# Patient Record
Sex: Female | Born: 1975 | Race: Black or African American | Hispanic: No | Marital: Married | Smoking: Never smoker
Health system: Southern US, Community
[De-identification: ages and names within clinical notes are randomized; demographics above are authoritative.]

## PROBLEM LIST (undated history)

## (undated) DIAGNOSIS — F4310 Post-traumatic stress disorder, unspecified: Secondary | ICD-10-CM

## (undated) DIAGNOSIS — R51 Headache: Secondary | ICD-10-CM

## (undated) DIAGNOSIS — I209 Angina pectoris, unspecified: Secondary | ICD-10-CM

## (undated) DIAGNOSIS — G629 Polyneuropathy, unspecified: Secondary | ICD-10-CM

## (undated) DIAGNOSIS — R519 Headache, unspecified: Secondary | ICD-10-CM

## (undated) DIAGNOSIS — G8929 Other chronic pain: Secondary | ICD-10-CM

## (undated) DIAGNOSIS — N390 Urinary tract infection, site not specified: Secondary | ICD-10-CM

## (undated) DIAGNOSIS — J189 Pneumonia, unspecified organism: Secondary | ICD-10-CM

## (undated) DIAGNOSIS — M545 Low back pain, unspecified: Secondary | ICD-10-CM

## (undated) DIAGNOSIS — F419 Anxiety disorder, unspecified: Secondary | ICD-10-CM

## (undated) DIAGNOSIS — E559 Vitamin D deficiency, unspecified: Secondary | ICD-10-CM

## (undated) HISTORY — PX: BACK SURGERY: SHX140

---
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
## 2002-07-15 ENCOUNTER — Ambulatory Visit (HOSPITAL_COMMUNITY): Admission: RE | Admit: 2002-07-15 | Discharge: 2002-07-15 | Payer: Self-pay | Admitting: Chiropractor

## 2002-07-15 ENCOUNTER — Encounter: Payer: Self-pay | Admitting: Chiropractor

## 2002-07-16 ENCOUNTER — Emergency Department (HOSPITAL_COMMUNITY): Admission: EM | Admit: 2002-07-16 | Discharge: 2002-07-16 | Payer: Self-pay | Admitting: Emergency Medicine

## 2002-11-03 ENCOUNTER — Emergency Department (HOSPITAL_COMMUNITY): Admission: EM | Admit: 2002-11-03 | Discharge: 2002-11-03 | Payer: Self-pay | Admitting: Emergency Medicine

## 2004-02-22 ENCOUNTER — Emergency Department (HOSPITAL_COMMUNITY): Admission: EM | Admit: 2004-02-22 | Discharge: 2004-02-22 | Payer: Self-pay | Admitting: Emergency Medicine

## 2005-04-17 ENCOUNTER — Emergency Department (HOSPITAL_COMMUNITY): Admission: EM | Admit: 2005-04-17 | Discharge: 2005-04-17 | Payer: Self-pay | Admitting: Emergency Medicine

## 2005-06-21 ENCOUNTER — Inpatient Hospital Stay (HOSPITAL_COMMUNITY): Admission: AD | Admit: 2005-06-21 | Discharge: 2005-06-21 | Payer: Self-pay | Admitting: *Deleted

## 2005-08-10 ENCOUNTER — Other Ambulatory Visit: Admission: RE | Admit: 2005-08-10 | Discharge: 2005-08-10 | Payer: Self-pay | Admitting: Obstetrics and Gynecology

## 2005-09-15 ENCOUNTER — Ambulatory Visit (HOSPITAL_COMMUNITY): Admission: RE | Admit: 2005-09-15 | Discharge: 2005-09-15 | Payer: Self-pay | Admitting: Obstetrics and Gynecology

## 2006-01-28 ENCOUNTER — Emergency Department (HOSPITAL_COMMUNITY): Admission: EM | Admit: 2006-01-28 | Discharge: 2006-01-28 | Payer: Self-pay | Admitting: Emergency Medicine

## 2006-08-07 HISTORY — PX: TUBAL LIGATION: SHX77

## 2006-12-21 ENCOUNTER — Encounter: Admission: RE | Admit: 2006-12-21 | Discharge: 2006-12-21 | Payer: Self-pay | Admitting: Orthopaedic Surgery

## 2006-12-26 ENCOUNTER — Encounter: Admission: RE | Admit: 2006-12-26 | Discharge: 2006-12-26 | Payer: Self-pay | Admitting: Orthopaedic Surgery

## 2007-01-22 ENCOUNTER — Encounter: Admission: RE | Admit: 2007-01-22 | Discharge: 2007-01-22 | Payer: Self-pay | Admitting: Orthopaedic Surgery

## 2007-08-05 ENCOUNTER — Emergency Department (HOSPITAL_COMMUNITY): Admission: EM | Admit: 2007-08-05 | Discharge: 2007-08-05 | Payer: Self-pay | Admitting: Emergency Medicine

## 2007-08-08 HISTORY — PX: REPLACEMENT DISC ANTERIOR LUMBAR SPINE: SUR1215

## 2008-01-21 ENCOUNTER — Encounter: Admission: RE | Admit: 2008-01-21 | Discharge: 2008-01-21 | Payer: Self-pay | Admitting: Gastroenterology

## 2008-12-26 ENCOUNTER — Emergency Department (HOSPITAL_COMMUNITY): Admission: EM | Admit: 2008-12-26 | Discharge: 2008-12-26 | Payer: Self-pay | Admitting: Family Medicine

## 2008-12-28 ENCOUNTER — Emergency Department (HOSPITAL_COMMUNITY): Admission: EM | Admit: 2008-12-28 | Discharge: 2008-12-28 | Payer: Self-pay | Admitting: Family Medicine

## 2009-04-04 ENCOUNTER — Emergency Department (HOSPITAL_COMMUNITY): Admission: EM | Admit: 2009-04-04 | Discharge: 2009-04-04 | Payer: Self-pay | Admitting: Emergency Medicine

## 2009-08-06 ENCOUNTER — Emergency Department (HOSPITAL_COMMUNITY): Admission: EM | Admit: 2009-08-06 | Discharge: 2009-08-06 | Payer: Self-pay | Admitting: Emergency Medicine

## 2010-02-15 ENCOUNTER — Emergency Department (HOSPITAL_COMMUNITY): Admission: EM | Admit: 2010-02-15 | Discharge: 2010-02-15 | Payer: Self-pay | Admitting: Emergency Medicine

## 2010-07-31 ENCOUNTER — Emergency Department (HOSPITAL_COMMUNITY)
Admission: EM | Admit: 2010-07-31 | Discharge: 2010-07-31 | Payer: Self-pay | Source: Home / Self Care | Admitting: Emergency Medicine
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
## 2010-12-23 NOTE — Op Note (Signed)
Monique Acosta, Monique Acosta               ACCOUNT NO.:  [id number]   MEDICAL RECORD NO.:  [id number]          PATIENT TYPE:  AMB   LOCATION:  SDC                           FACILITY:  [facility]   PHYSICIAN:  James A. Ashley Royalty, M.D.DATE OF BIRTH:  [birth year]   DATE OF PROCEDURE:  09/15/2005  DATE OF DISCHARGE:                                 OPERATIVE REPORT   PREOPERATIVE DIAGNOSIS:  Desire for attempt at permanent surgical  sterilization.   POSTOPERATIVE DIAGNOSIS:  Desire for attempt at permanent surgical  sterilization.   PROCEDURE:  Laparoscopic bilateral tubal sterilization procedure (Falope  rings).   SURGEON:  Rudy Jew. Ashley Royalty, M.D.   ANESTHESIA:  General.   ESTIMATED BLOOD LOSS:  Less than 25.   COMPLICATIONS:  None.   PACKS AND DRAINS:  None.   PROCEDURE:  The patient was taken to the operating room and placed in the  dorsal supine position.  After adequate general anesthesia was administered,  she was placed in the lithotomy position and prepped and draped in the usual  manner for abdominal surgery and vaginal surgery.  A posterior weighted  retractor was retractor was placed per vagina and the anterior lip of the  cervix was grasped with a single-tooth tenaculum.  The Jarcho uterine  manipulator was placed per cervix and held in place with a tenaculum.  The  bladder was drained with a red rubber catheter.  Next a 1.2 cm  infraumbilical skin incision was made in the transverse plane.  The Veress  needle was inserted in the abdominal cavity in its location verified by  instillation of saline and hanging drop techniques.  An attempt was made to  create  pneumoperitoneum using CO2 AT l L/min.  Unfortunately, there was  satisfactory flow.  Hence, further attempts to use the Veress needle were  abandoned and direct trocar insertion employed.  A trocar was placed into  the abdominal cavity without difficulty.  The diameter was approximately 1.2  cm.  The laparoscope was placed and  the pelvis visualized.  The uterus was  normal size, shape and contour without evidence of any obvious fibroids or  endometriosis.  The ovaries were normal size, shape and contour bilaterally  without evidence of any cysts or endometriosis.  The fallopian tubes were  normal size, shape, contour and length bilaterally.  The remainder of the  peritoneal surfaces were smooth and glistening.  Next, a Falope ring trocar  was inserted into the abdominal cavity in a suprapubic fashion.  Transillumination and direct visualization techniques were employed.  There  was no evidence of any trauma.  An avascular area in the distal isthmic to  proximal ampullary portion was chosen for ring placement.  A Falope ring was  applied without difficulty in this region.  An excellent knuckle of tube was  noted be contained within the ring.  Excellent blanching of tissue was  noted.  Appropriate photos were obtained.  Next the left fallopian tube was  grasped from the distal isthmic to proximal ampullary portion.  A Falope  ring was applied without difficulty.  An excellent knuckle  of tube was noted  to be contained within the ring.  Excellent blanching of tissue was noted.  Appropriate photograph was obtained.   At this point the patient was felt to have benefited maximally from the  surgical procedure.  The abdominal instruments were then removed and  pneumoperitoneum evacuated.  The fascial defects were closed with 0 Vicryl  in an interrupted fashion.  The skin was closed with 3-0 Monocryl in a  subcuticular fashion.  Marcaine 0.5% with epinephrine was instilled in the  incisions to aid in postoperative analgesia.  Approximately 10 mL total was  used.  At this point the vaginal instruments were removed, hemostasis noted  and the procedure terminated.   The patient tolerated the procedure extremely well and was returned to the  recovery room in good condition.      James A. Ashley Royalty, M.D.   Electronically Signed     JAM/MEDQ  D:  09/15/2005  T:  09/15/2005  Job:  [id number]

## 2011-04-12 ENCOUNTER — Emergency Department (HOSPITAL_COMMUNITY)
Admission: EM | Admit: 2011-04-12 | Discharge: 2011-04-12 | Disposition: A | Discharge status: Home / Self Care | Payer: 59 | Attending: Emergency Medicine | Admitting: Emergency Medicine

## 2011-04-12 DIAGNOSIS — L2989 Other pruritus: Secondary | ICD-10-CM | POA: Insufficient documentation

## 2011-04-12 DIAGNOSIS — G8929 Other chronic pain: Secondary | ICD-10-CM | POA: Insufficient documentation

## 2011-04-12 DIAGNOSIS — R5381 Other malaise: Secondary | ICD-10-CM | POA: Insufficient documentation

## 2011-04-12 DIAGNOSIS — R0682 Tachypnea, not elsewhere classified: Secondary | ICD-10-CM | POA: Insufficient documentation

## 2011-04-12 DIAGNOSIS — R220 Localized swelling, mass and lump, head: Secondary | ICD-10-CM | POA: Insufficient documentation

## 2011-04-12 DIAGNOSIS — R21 Rash and other nonspecific skin eruption: Secondary | ICD-10-CM | POA: Insufficient documentation

## 2011-04-12 DIAGNOSIS — T6391XA Toxic effect of contact with unspecified venomous animal, accidental (unintentional), initial encounter: Secondary | ICD-10-CM | POA: Insufficient documentation

## 2011-04-12 DIAGNOSIS — M549 Dorsalgia, unspecified: Secondary | ICD-10-CM | POA: Insufficient documentation

## 2011-04-12 DIAGNOSIS — R0602 Shortness of breath: Secondary | ICD-10-CM | POA: Insufficient documentation

## 2011-04-12 DIAGNOSIS — T63481A Toxic effect of venom of other arthropod, accidental (unintentional), initial encounter: Secondary | ICD-10-CM | POA: Insufficient documentation

## 2011-04-12 DIAGNOSIS — R221 Localized swelling, mass and lump, neck: Secondary | ICD-10-CM | POA: Insufficient documentation

## 2011-04-12 DIAGNOSIS — R0789 Other chest pain: Secondary | ICD-10-CM | POA: Insufficient documentation

## 2011-04-12 DIAGNOSIS — L509 Urticaria, unspecified: Secondary | ICD-10-CM | POA: Insufficient documentation

## 2011-04-12 DIAGNOSIS — L298 Other pruritus: Secondary | ICD-10-CM | POA: Insufficient documentation

## 2012-06-07 DIAGNOSIS — J189 Pneumonia, unspecified organism: Secondary | ICD-10-CM

## 2012-06-07 HISTORY — DX: Pneumonia, unspecified organism: J18.9

## 2012-06-25 ENCOUNTER — Emergency Department (HOSPITAL_COMMUNITY): Payer: 59

## 2012-06-25 ENCOUNTER — Encounter (HOSPITAL_COMMUNITY): Payer: Self-pay | Admitting: *Deleted

## 2012-06-25 ENCOUNTER — Emergency Department (HOSPITAL_COMMUNITY)
Admission: EM | Admit: 2012-06-25 | Discharge: 2012-06-26 | Disposition: A | Discharge status: Home / Self Care | Payer: 59 | Attending: Emergency Medicine | Admitting: Emergency Medicine

## 2012-06-25 DIAGNOSIS — R350 Frequency of micturition: Secondary | ICD-10-CM | POA: Insufficient documentation

## 2012-06-25 DIAGNOSIS — R0602 Shortness of breath: Secondary | ICD-10-CM | POA: Insufficient documentation

## 2012-06-25 DIAGNOSIS — M549 Dorsalgia, unspecified: Secondary | ICD-10-CM | POA: Insufficient documentation

## 2012-06-25 DIAGNOSIS — R0781 Pleurodynia: Secondary | ICD-10-CM

## 2012-06-25 DIAGNOSIS — R7989 Other specified abnormal findings of blood chemistry: Secondary | ICD-10-CM

## 2012-06-25 DIAGNOSIS — Z9889 Other specified postprocedural states: Secondary | ICD-10-CM | POA: Insufficient documentation

## 2012-06-25 DIAGNOSIS — F4310 Post-traumatic stress disorder, unspecified: Secondary | ICD-10-CM | POA: Insufficient documentation

## 2012-06-25 DIAGNOSIS — R110 Nausea: Secondary | ICD-10-CM | POA: Insufficient documentation

## 2012-06-25 DIAGNOSIS — Z79899 Other long term (current) drug therapy: Secondary | ICD-10-CM | POA: Insufficient documentation

## 2012-06-25 DIAGNOSIS — R791 Abnormal coagulation profile: Secondary | ICD-10-CM | POA: Insufficient documentation

## 2012-06-25 DIAGNOSIS — R071 Chest pain on breathing: Secondary | ICD-10-CM | POA: Insufficient documentation

## 2012-06-25 DIAGNOSIS — R3915 Urgency of urination: Secondary | ICD-10-CM | POA: Insufficient documentation

## 2012-06-25 HISTORY — DX: Post-traumatic stress disorder, unspecified: F43.10

## 2012-06-25 HISTORY — DX: Vitamin D deficiency, unspecified: E55.9

## 2012-06-25 LAB — URINALYSIS, ROUTINE W REFLEX MICROSCOPIC
Bilirubin Urine: NEGATIVE
Hgb urine dipstick: NEGATIVE
Specific Gravity, Urine: 1.026 (ref 1.005–1.030)
pH: 7 (ref 5.0–8.0)

## 2012-06-25 LAB — COMPREHENSIVE METABOLIC PANEL
Albumin: 3.8 g/dL (ref 3.5–5.2)
Alkaline Phosphatase: 83 U/L (ref 39–117)
BUN: 14 mg/dL (ref 6–23)
CO2: 24 mEq/L (ref 19–32)
Chloride: 103 mEq/L (ref 96–112)
GFR calc Af Amer: >90 mL/min (ref >90)
Glucose, Bld: 102 mg/dL — ABNORMAL HIGH (ref 70–99)
Potassium: 3.6 mEq/L (ref 3.5–5.1)
Total Bilirubin: 0.3 mg/dL (ref 0.3–1.2)

## 2012-06-25 LAB — POCT I-STAT, CHEM 8
BUN: 15 mg/dL (ref 6–23)
Calcium, Ion: 1.16 mmol/L (ref 1.12–1.23)
Creatinine, Ser: 0.7 mg/dL (ref 0.50–1.10)
TCO2: 26 mmol/L (ref 0–100)

## 2012-06-25 LAB — CBC
Hemoglobin: 11.4 g/dL — ABNORMAL LOW (ref 12.0–15.0)
MCH: 26.4 pg (ref 26.0–34.0)
MCHC: 33.5 g/dL (ref 30.0–36.0)
MCV: 78.7 fL (ref 78.0–100.0)
Platelets: 245 10*3/uL (ref 150–400)
RBC: 4.32 MIL/uL (ref 3.87–5.11)

## 2012-06-25 LAB — D-DIMER, QUANTITATIVE: D-Dimer, Quant: 0.61 ug/mL-FEU — ABNORMAL HIGH (ref 0.00–0.48)

## 2012-06-25 MED ORDER — KETOROLAC TROMETHAMINE 30 MG/ML IJ SOLN
30.0000 mg | Freq: Once | INTRAMUSCULAR | Status: AC
  Administered 2012-06-25: 30 mg via INTRAVENOUS
  Filled 2012-06-25: qty 1

## 2012-06-25 MED ORDER — HYDROMORPHONE HCL PF 1 MG/ML IJ SOLN
1.0000 mg | Freq: Once | INTRAMUSCULAR | Status: AC
  Administered 2012-06-26: 1 mg via INTRAVENOUS
  Filled 2012-06-25: qty 1

## 2012-06-25 MED ORDER — SODIUM CHLORIDE 0.9 % IV BOLUS (SEPSIS)
1000.0000 mL | Freq: Once | INTRAVENOUS | Status: AC
  Administered 2012-06-25: 1000 mL via INTRAVENOUS

## 2012-06-25 MED ORDER — LORAZEPAM 2 MG/ML IJ SOLN
1.0000 mg | Freq: Once | INTRAMUSCULAR | Status: AC
  Administered 2012-06-26: 1 mg via INTRAVENOUS
  Filled 2012-06-25: qty 1

## 2012-06-25 NOTE — ED Notes (Signed)
Patient reports she had onset of right upper back/side pain 3 days ago.  She thought it was urinary related due to frequency.  The pain in her side has increased and she is having nausea and shortness of breath.  Patient states her pain is aching with sharp stabbing pain intermittently

## 2012-06-25 NOTE — ED Notes (Addendum)
Pt A.O. X 4. Complains of shooting lower back pain 6/10. Increases with movement to 8/10. Denies lumbar injury. Denies SOB. Denies Chest pain.   Denies any other pain. Family at bedside. Vitals stable. No further needs at this time.

## 2012-06-25 NOTE — ED Provider Notes (Signed)
History     CSN: [id number]  Arrival date & time 06/25/12  1722   First MD Initiated Contact with Patient 06/25/12 2021      Chief Complaint  Patient presents with  . Chest Pain  . Back Pain  . Nausea  . Shortness of Breath    (Consider location/radiation/quality/duration/timing/severity/associated sxs/prior treatment) HPI Comments: Patient presents with 3 day history of urinary frequency and right upper back pain. She rates the pain 8/10 with radiation to her right chest. She has tried OTC pain relievers without improvement. She states the pain is worse with inspiration.  Denies fevers or chills. Denies dysuria or hematuria. Denies vomiting or diarrhea but reports nausea. Denies history of blood clot, cancer, recent surgery, smoking, OCP use, but does report a recent [place] trip [place] the mountains 6 days ago. LMP: 05/21/12  The history is provided by the patient. No language interpreter was used.    Past Medical History  Diagnosis Date  . Vitamin D deficiency   . PTSD (post-traumatic stress disorder)     Past Surgical History  Procedure Date  . Back surgery   . Knee surgery   . Shoulder surgery   . Tubal ligation     History reviewed. No pertinent family history.  History  Substance Use Topics  . Smoking status: Never Smoker   . Smokeless tobacco: Not on file  . Alcohol Use: Yes    OB History    Grav Para Term Preterm Abortions TAB SAB Ect Mult Living                  Review of Systems  Constitutional: Negative for fever and chills.  Gastrointestinal: Positive for nausea. Negative for vomiting, abdominal pain and diarrhea.  Genitourinary: Positive for urgency and frequency. Negative for dysuria and hematuria.    Allergies  Bee venom; Imitrex; Other; Azithromycin; Clindamycin/lincomycin; Keflex; Macrodantin; Penicillins; and Sulfa antibiotics  Home Medications   Current Outpatient Rx  Name  Route  Sig  Dispense  Refill  . BC HEADACHE POWDER PO   Oral  Take 2 Packages by mouth 3 (three) times daily as needed. For headaches         . VITAMIN D (ERGOCALCIFEROL) 50000 UNITS PO CAPS   Oral   Take 50,000 Units by mouth every 7 (seven) days. On Monday         . ZOLPIDEM TARTRATE 10 MG PO TABS   Oral   Take 10 mg by mouth at bedtime.           BP 129/88  Pulse 74  Temp 97.9 F (36.6 C) (Oral)  Resp 16  Ht 5' 5.5" (1.664 m)  Wt 174 lb (78.926 kg)  BMI 28.51 kg/m2  SpO2 99%  Physical Exam  Nursing note and vitals reviewed. Constitutional: She appears well-developed and well-nourished.  HENT:  Head: Normocephalic and atraumatic.  Mouth/Throat: Oropharynx is clear and moist.  Eyes: Conjunctivae normal and EOM are normal. No scleral icterus.  Neck: Normal range of motion. Neck supple.  Cardiovascular: Normal rate, regular rhythm and normal heart sounds.   Pulmonary/Chest: Effort normal and breath sounds normal. She exhibits no tenderness.  Abdominal: Soft. Bowel sounds are normal. There is no tenderness.       No suprapubic or CVA tenderness.  Neurological: She is alert.  Skin: Skin is warm and dry.    ED Course  Procedures (including critical care time)  Labs Reviewed  CBC - Abnormal; Notable for the following:  Hemoglobin 11.4 (*)     HCT 34.0 (*)     All other components within normal limits  POCT I-STAT, CHEM 8 - Abnormal; Notable for the following:    Hemoglobin 11.9 (*)     HCT 35.0 (*)     All other components within normal limits  POCT I-STAT TROPONIN I  URINALYSIS, ROUTINE W REFLEX MICROSCOPIC   Results for orders placed during the hospital encounter of 06/25/12  CBC      Component Value Range   WBC 5.2  4.0 - 10.5 K/uL   RBC 4.32  3.87 - 5.11 MIL/uL   Hemoglobin 11.4 (*) 12.0 - 15.0 g/dL   HCT 08.6 (*) 57.8 - 46.9 %   MCV 78.7  78.0 - 100.0 fL   MCH 26.4  26.0 - 34.0 pg   MCHC 33.5  30.0 - 36.0 g/dL   RDW 62.9  52.8 - 41.3 %   Platelets 245  150 - 400 K/uL  POCT I-STAT, CHEM 8      Component  Value Range   Sodium 139  135 - 145 mEq/L   Potassium 4.0  3.5 - 5.1 mEq/L   Chloride 103  96 - 112 mEq/L   BUN 15  6 - 23 mg/dL   Creatinine, Ser 2.44  0.50 - 1.10 mg/dL   Glucose, Bld 90  70 - 99 mg/dL   Calcium, Ion 0.10  2.72 - 1.23 mmol/L   TCO2 26  0 - 100 mmol/L   Hemoglobin 11.9 (*) 12.0 - 15.0 g/dL   HCT 53.6 (*) 64.4 - 03.4 %  POCT I-STAT TROPONIN I      Component Value Range   Troponin i, poc 0.00  0.00 - 0.08 ng/mL   Comment 3           URINALYSIS, ROUTINE W REFLEX MICROSCOPIC      Component Value Range   Color, Urine YELLOW  YELLOW   APPearance CLEAR  CLEAR   Specific Gravity, Urine 1.026  1.005 - 1.030   pH 7.0  5.0 - 8.0   Glucose, UA NEGATIVE  NEGATIVE mg/dL   Hgb urine dipstick NEGATIVE  NEGATIVE   Bilirubin Urine NEGATIVE  NEGATIVE   Ketones, ur 15 (*) NEGATIVE mg/dL   Protein, ur NEGATIVE  NEGATIVE mg/dL   Urobilinogen, UA 1.0  0.0 - 1.0 mg/dL   Nitrite NEGATIVE  NEGATIVE   Leukocytes, UA NEGATIVE  NEGATIVE  COMPREHENSIVE METABOLIC PANEL      Component Value Range   Sodium 136  135 - 145 mEq/L   Potassium 3.6  3.5 - 5.1 mEq/L   Chloride 103  96 - 112 mEq/L   CO2 24  19 - 32 mEq/L   Glucose, Bld 102 (*) 70 - 99 mg/dL   BUN 14  6 - 23 mg/dL   Creatinine, Ser 7.42  0.50 - 1.10 mg/dL   Calcium 9.1  8.4 - 59.5 mg/dL   Total Protein 7.5  6.0 - 8.3 g/dL   Albumin 3.8  3.5 - 5.2 g/dL   AST 13  0 - 37 U/L   ALT 11  0 - 35 U/L   Alkaline Phosphatase 83  39 - 117 U/L   Total Bilirubin 0.3  0.3 - 1.2 mg/dL   GFR calc non Af Amer >90  >90 mL/min   GFR calc Af Amer >90  >90 mL/min  D-DIMER, QUANTITATIVE      Component Value Range   D-Dimer, Quant 0.61 (*)  0.00 - 0.48 ug/mL-FEU    Dg Chest 2 View  06/25/2012  *RADIOLOGY REPORT*  Clinical Data: Back pain, nausea  CHEST - 2 VIEW  Comparison: Chest radiograph 05/22/ 2010  Findings: Stable cardiac silhouette.  There are low lung volumes. There is interval improvement in the air space disease seen in the left  lower lobe on comparison exam.  No effusion, infiltrate, or pneumothorax  IMPRESSION:  Low lung volumes.  No acute cardiopulmonary findings.   Original Report Authenticated By: Genevive Bi, M.D.      1. Chest pain, pleuritic   2. Elevated d-dimer       MDM  Patient presented with right sided chest and back pain. EKG, CBC, CMP, and UA: unremarkable. D-dimer: elevated. CXR: unremarkable. CTA: unremarkable. Patient discusses and seen with Dr. Freida Busman who states the patient can be discharged pending negative results.        Pixie Casino, PA-C 06/26/12 0040

## 2012-06-25 NOTE — ED Notes (Signed)
Pt in CT at this time. Introduced self to family, offered soda (accepted).

## 2012-06-25 NOTE — ED Provider Notes (Signed)
Medical screening examination/treatment/procedure(s) were conducted as a shared visit with non-physician practitioner(s) and myself.  I personally evaluated the patient during the encounter  Patient with likely pleurisy. Will check CT to rule out PE  Toy Baker, MD 06/25/12 2311

## 2012-06-26 ENCOUNTER — Encounter (HOSPITAL_COMMUNITY): Payer: Self-pay | Admitting: Radiology

## 2012-06-26 MED ORDER — LORAZEPAM 1 MG PO TABS: 1.0000 mg | ORAL_TABLET | Freq: Four times a day (QID) | ORAL | Status: DC | PRN

## 2012-06-26 MED ORDER — IOHEXOL 350 MG/ML SOLN
100.0000 mL | Freq: Once | INTRAVENOUS | Status: AC | PRN
  Administered 2012-06-26: 100 mL via INTRAVENOUS

## 2012-06-26 MED ORDER — IBUPROFEN 800 MG PO TABS: 800.0000 mg | ORAL_TABLET | Freq: Three times a day (TID) | ORAL | Status: DC

## 2012-06-26 MED ORDER — OXYCODONE-ACETAMINOPHEN 5-325 MG PO TABS: 1.0000 | ORAL_TABLET | Freq: Four times a day (QID) | ORAL | Status: DC | PRN

## 2012-06-26 NOTE — ED Notes (Signed)
Pt alert, NAD, calm, interactive, family at [facility], NSR on monitor, getting into gown.

## 2012-06-26 NOTE — ED Provider Notes (Signed)
Medical screening examination/treatment/procedure(s) were conducted as a shared visit with non-physician practitioner(s) and myself.  I personally evaluated the patient during the encounter  Toy Baker, MD 06/26/12 2242

## 2012-06-26 NOTE — ED Notes (Signed)
Husband at [facility], d/c instructions reviewed, denies questions, denies pain, "feels better", to go out in w/c, VSS, sedated/sleepy/arousable, interactive,NAD, calm.

## 2012-06-26 NOTE — ED Notes (Signed)
Back from CT

## 2012-07-01 ENCOUNTER — Emergency Department (HOSPITAL_COMMUNITY)
Admission: EM | Admit: 2012-07-01 | Discharge: 2012-07-02 | Disposition: A | Discharge status: Home / Self Care | Payer: 59 | Attending: Emergency Medicine | Admitting: Emergency Medicine

## 2012-07-01 ENCOUNTER — Emergency Department (HOSPITAL_COMMUNITY): Payer: 59

## 2012-07-01 DIAGNOSIS — M549 Dorsalgia, unspecified: Secondary | ICD-10-CM | POA: Insufficient documentation

## 2012-07-01 DIAGNOSIS — J189 Pneumonia, unspecified organism: Secondary | ICD-10-CM

## 2012-07-01 DIAGNOSIS — R059 Cough, unspecified: Secondary | ICD-10-CM | POA: Insufficient documentation

## 2012-07-01 DIAGNOSIS — Z8659 Personal history of other mental and behavioral disorders: Secondary | ICD-10-CM | POA: Insufficient documentation

## 2012-07-01 DIAGNOSIS — R509 Fever, unspecified: Secondary | ICD-10-CM | POA: Insufficient documentation

## 2012-07-01 DIAGNOSIS — Z9889 Other specified postprocedural states: Secondary | ICD-10-CM | POA: Insufficient documentation

## 2012-07-01 DIAGNOSIS — J159 Unspecified bacterial pneumonia: Secondary | ICD-10-CM | POA: Insufficient documentation

## 2012-07-01 DIAGNOSIS — E559 Vitamin D deficiency, unspecified: Secondary | ICD-10-CM | POA: Insufficient documentation

## 2012-07-01 DIAGNOSIS — R05 Cough: Secondary | ICD-10-CM | POA: Insufficient documentation

## 2012-07-01 DIAGNOSIS — Z79899 Other long term (current) drug therapy: Secondary | ICD-10-CM | POA: Insufficient documentation

## 2012-07-01 LAB — POCT I-STAT TROPONIN I: Troponin i, poc: 0.01 ng/mL (ref 0.00–0.08)

## 2012-07-01 LAB — PRO B NATRIURETIC PEPTIDE: Pro B Natriuretic peptide (BNP): 15.5 pg/mL (ref 0–125)

## 2012-07-01 LAB — BASIC METABOLIC PANEL
Calcium: 10 mg/dL (ref 8.4–10.5)
GFR calc non Af Amer: >90 mL/min (ref >90)
Sodium: 136 mEq/L (ref 135–145)

## 2012-07-01 LAB — CBC
MCH: 26.7 pg (ref 26.0–34.0)
MCHC: 33.7 g/dL (ref 30.0–36.0)
Platelets: 273 10*3/uL (ref 150–400)
RBC: 4.45 MIL/uL (ref 3.87–5.11)

## 2012-07-01 MED ORDER — LEVOFLOXACIN 750 MG PO TABS: 750.0000 mg | ORAL_TABLET | Freq: Every day | ORAL | Status: DC

## 2012-07-01 MED ORDER — ONDANSETRON HCL 4 MG/2ML IJ SOLN
4.0000 mg | Freq: Once | INTRAMUSCULAR | Status: AC
  Administered 2012-07-01: 4 mg via INTRAVENOUS
  Filled 2012-07-01: qty 2

## 2012-07-01 MED ORDER — METHYLPREDNISOLONE SODIUM SUCC 125 MG IJ SOLR
125.0000 mg | Freq: Once | INTRAMUSCULAR | Status: AC
  Administered 2012-07-01: 125 mg via INTRAVENOUS
  Filled 2012-07-01: qty 2

## 2012-07-01 MED ORDER — LEVOFLOXACIN IN D5W 750 MG/150ML IV SOLN
750.0000 mg | INTRAVENOUS | Status: DC
  Administered 2012-07-01: 750 mg via INTRAVENOUS
  Filled 2012-07-01: qty 150

## 2012-07-01 MED ORDER — SODIUM CHLORIDE 0.9 % IV BOLUS (SEPSIS)
1000.0000 mL | Freq: Once | INTRAVENOUS | Status: AC
  Administered 2012-07-01: 1000 mL via INTRAVENOUS

## 2012-07-01 MED ORDER — HYDROCODONE-ACETAMINOPHEN 5-500 MG PO TABS: 1.0000 | ORAL_TABLET | Freq: Four times a day (QID) | ORAL | Status: DC | PRN

## 2012-07-01 MED ORDER — HYDROMORPHONE HCL PF 1 MG/ML IJ SOLN
1.0000 mg | Freq: Once | INTRAMUSCULAR | Status: AC
  Administered 2012-07-01: 1 mg via INTRAVENOUS
  Filled 2012-07-01: qty 1

## 2012-07-01 MED ORDER — DIPHENHYDRAMINE HCL 50 MG/ML IJ SOLN
25.0000 mg | Freq: Once | INTRAMUSCULAR | Status: AC
  Administered 2012-07-01: 25 mg via INTRAVENOUS
  Filled 2012-07-01: qty 1

## 2012-07-01 NOTE — ED Notes (Signed)
Pt began breaking out in hives after she received IV levaquin.  EDP Steinl notified and he went to evaluate pt.

## 2012-07-01 NOTE — ED Provider Notes (Addendum)
History     CSN: [id number]  Arrival date & time 07/01/12  1191   First MD Initiated Contact with Patient 07/01/12 2106      Chief Complaint  Patient presents with  . Chest Pain    (Consider location/radiation/quality/duration/timing/severity/associated sxs/prior treatment) Patient is a 36 y.o. female presenting with chest pain. The history is provided by the patient.  Chest Pain Primary symptoms include cough. Pertinent negatives for primary symptoms include no shortness of breath and no abdominal pain.  Pertinent negatives for associated symptoms include no diaphoresis.   pt c/o left mid back pain, productive cough, subj fever. States symptoms began approximately 9-10 days ago. Pain constant, dull, non radiating, worse w cough, movement, deep breath. Denies sore throat, or nasal congestion. No body aches. No chills.  No known ill contacts. Was seen in ed within past week for same pain, states was told labs and ct scan negative for blood clot. No immobility, trauma, recent surgery, or travel.  No hx dvt or pe. No anterior/chest pain. No sob. No abd pain. Nausea. No vomiting or diarrhea. No dysuria or hematuria.     Past Medical History  Diagnosis Date  . Vitamin D deficiency   . PTSD (post-traumatic stress disorder)     Past Surgical History  Procedure Date  . Back surgery   . Knee surgery   . Shoulder surgery   . Tubal ligation     No family history on file.  History  Substance Use Topics  . Smoking status: Never Smoker   . Smokeless tobacco: Not on file  . Alcohol Use: Yes    OB History    Grav Para Term Preterm Abortions TAB SAB Ect Mult Living                  Review of Systems  Constitutional: Negative for chills, diaphoresis and unexpected weight change.  HENT: Negative for sore throat and neck pain.   Eyes: Negative for redness.  Respiratory: Positive for cough. Negative for shortness of breath.   Cardiovascular: Negative for leg swelling.    Gastrointestinal: Negative for abdominal pain, diarrhea and constipation.  Genitourinary: Negative for dysuria and hematuria.  Musculoskeletal: Positive for back pain.  Skin: Negative for rash.  Neurological: Negative for headaches.  Hematological: Does not bruise/bleed easily.  Psychiatric/Behavioral: Negative for confusion.    Allergies  Bee venom; Imitrex; Other; Azithromycin; Clindamycin/lincomycin; Keflex; Macrodantin; Penicillins; and Sulfa antibiotics  Home Medications   Current Outpatient Rx  Name  Route  Sig  Dispense  Refill  . BC HEADACHE POWDER PO   Oral   Take 2 Packages by mouth 3 (three) times daily as needed. For headaches         . IBUPROFEN 800 MG PO TABS   Oral   Take 1 tablet (800 mg total) by mouth 3 (three) times daily.   21 tablet   0   . OXYCODONE-ACETAMINOPHEN 5-325 MG PO TABS   Oral   Take 1 tablet by mouth every 6 (six) hours as needed for pain.   15 tablet   0   . VITAMIN D (ERGOCALCIFEROL) 50000 UNITS PO CAPS   Oral   Take 50,000 Units by mouth every 7 (seven) days. On Monday         . ZOLPIDEM TARTRATE 10 MG PO TABS   Oral   Take 10 mg by mouth at bedtime.           BP 119/87  Pulse 114  Temp 98.4 F (36.9 C)  Resp 20  SpO2 98%  LMP 05/25/2012  Physical Exam  Nursing note and vitals reviewed. Constitutional: She is oriented to person, place, and time. She appears well-developed and well-nourished. No distress.  HENT:  Nose: Nose normal.  Mouth/Throat: Oropharynx is clear and moist.  Eyes: Conjunctivae normal are normal. No scleral icterus.  Neck: Neck supple. No tracheal deviation present.  Cardiovascular: Normal rate, regular rhythm, normal heart sounds and intact distal pulses.  Exam reveals no gallop and no friction rub.   No murmur heard. Pulmonary/Chest: Effort normal. No respiratory distress. She has rales.       Few scattered rales left base.   Abdominal: Soft. Normal appearance and bowel sounds are normal. She  exhibits no distension. There is no tenderness.  Genitourinary:       No cva tenderness  Musculoskeletal: She exhibits no edema and no tenderness.  Neurological: She is alert and oriented to person, place, and time.  Skin: Skin is warm and dry. No rash noted. She is not diaphoretic.       No shingles or rash in area of pain  Psychiatric: She has a normal mood and affect.    ED Course  Procedures (including critical care time)  Results for orders placed during the hospital encounter of 07/01/12  CBC      Component Value Range   WBC 13.3 (*) 4.0 - 10.5 K/uL   RBC 4.45  3.87 - 5.11 MIL/uL   Hemoglobin 11.9 (*) 12.0 - 15.0 g/dL   HCT 16.1 (*) 09.6 - 04.5 %   MCV 79.3  78.0 - 100.0 fL   MCH 26.7  26.0 - 34.0 pg   MCHC 33.7  30.0 - 36.0 g/dL   RDW 40.9  81.1 - 91.4 %   Platelets 273  150 - 400 K/uL  BASIC METABOLIC PANEL      Component Value Range   Sodium 136  135 - 145 mEq/L   Potassium 4.5  3.5 - 5.1 mEq/L   Chloride 100  96 - 112 mEq/L   CO2 27  19 - 32 mEq/L   Glucose, Bld 91  70 - 99 mg/dL   BUN 11  6 - 23 mg/dL   Creatinine, Ser 7.82  0.50 - 1.10 mg/dL   Calcium 95.6  8.4 - 21.3 mg/dL   GFR calc non Af Amer >90  >90 mL/min   GFR calc Af Amer >90  >90 mL/min  PRO B NATRIURETIC PEPTIDE      Component Value Range   Pro B Natriuretic peptide (BNP) 15.5  0 - 125 pg/mL  POCT I-STAT TROPONIN I      Component Value Range   Troponin i, poc 0.01  0.00 - 0.08 ng/mL   Comment 3            Dg Chest 2 View  07/01/2012  *RADIOLOGY REPORT*  Clinical Data: Chest pain  CHEST - 2 VIEW  Comparison: June 25, 2012  Findings:  There is a small area of infiltrate in the left base. The lungs are otherwise clear.  Heart size and pulmonary vascularity are normal.  No adenopathy.  No bone lesions.  IMPRESSION: Left base infiltrate.   Original Report Authenticated By: Bretta Bang, M.D.    Dg Chest 2 View  06/25/2012  *RADIOLOGY REPORT*  Clinical Data: Back pain, nausea  CHEST - 2 VIEW   Comparison: Chest radiograph 05/22/ 2010  Findings: Stable cardiac silhouette.  There are  low lung volumes. There is interval improvement in the air space disease seen in the left lower lobe on comparison exam.  No effusion, infiltrate, or pneumothorax  IMPRESSION:  Low lung volumes.  No acute cardiopulmonary findings.   Original Report Authenticated By: Genevive Bi, M.D.    Ct Angio Chest Pe W/cm &/or Wo Cm  06/26/2012  *RADIOLOGY REPORT*  Clinical Data: Elevated D-dimer, shortness of breath.  CT ANGIOGRAPHY CHEST  Technique:  Multidetector CT imaging of the chest using the standard protocol during bolus administration of intravenous contrast. Multiplanar reconstructed images including MIPs were obtained and reviewed to evaluate the vascular anatomy.  Contrast: OMNIPAQUE IOHEXOL 350 MG/ML SOLN  Comparison: 06/25/2029 radiograph  Findings: Pulmonary arterial branches are patent.  Normal caliber aorta.  Heart size upper normal to mildly enlarged. No pleural or pericardial effusion.  No intrathoracic lymphadenopathy.  Central airways are patent.  Lungs are clear.  No pneumothorax.  Limited images through the upper abdomen show water attenuation foci within the liver, most in keeping with biliary cysts.  No acute osseous finding.  IMPRESSION: No pulmonary embolism or acute intrathoracic process.   Original Report Authenticated By: Jearld Lesch, M.D.          MDM  Iv ns. Labs. Cxr.  Reviewed nursing notes and prior charts for additional history.   Reviewed recent ed workup for same symptoms, ua neg, labs unremarkable, ct chest neg for pe or other acute process.  todays cxr w left basilar infil, mildly elevated wbc.  pts multiple abx allergies noted.  avelox iv - formulary substituted levaquin 750 iv.   Dilaudid 1 mg iv.     Date: 07/01/2012  Rate: 113  Rhythm: sinus tachycardia  QRS Axis: normal  Intervals: normal  ST/T Wave abnormalities: normal  Conduction  Disutrbances:none  Narrative Interpretation:   Old EKG Reviewed: changes noted   Recheck pt comfortable. Hr 84 rr 16. Pulse ox 98% room air.  pcp is [facility], pt also goes to [facility] - will f/u closely.   Pt with itching after levaquin complete. Pt states has taken levaquin multiple x in past without adverse rxn. No wheezing. No throat swelling. No faintness. Pulse ox 100%. Benadryl iv/ solumedrol iv.   Discussed abx tx at home w pt. Pt states much worse rxns to pcns, ceph, azithromycin, sulfa, macrobid, and doxycycline (which hadnt initially appeared on her list).  Pt requests rx for levaquin for home, states has taken multiple x in past without rxn, and that mild itching in ed, much less of rxn than to other other abxs listed above.  Discussed w pt taking benadryl prn, and to return to ER right away if rash/hives, severe itching, or other allergic rxn symptoms.     Suzi Roots, MD 07/01/12 2313  Suzi Roots, MD 07/01/12 1308  Suzi Roots, MD 07/01/12 [phone]

## 2012-07-01 NOTE — ED Notes (Signed)
Pt c/o stabbing pain in right back and right breast since 11/16.  Also c/o nausea today, some dizziness and SOB

## 2012-07-06 ENCOUNTER — Emergency Department (HOSPITAL_COMMUNITY)
Admission: EM | Admit: 2012-07-06 | Discharge: 2012-07-06 | Discharge status: Against Medical Advice | Payer: 59 | Attending: Emergency Medicine | Admitting: Emergency Medicine

## 2012-07-06 ENCOUNTER — Encounter (HOSPITAL_COMMUNITY): Payer: Self-pay | Admitting: *Deleted

## 2012-07-06 DIAGNOSIS — R112 Nausea with vomiting, unspecified: Secondary | ICD-10-CM | POA: Insufficient documentation

## 2012-07-06 DIAGNOSIS — Z9889 Other specified postprocedural states: Secondary | ICD-10-CM | POA: Insufficient documentation

## 2012-07-06 DIAGNOSIS — Z79899 Other long term (current) drug therapy: Secondary | ICD-10-CM | POA: Insufficient documentation

## 2012-07-06 DIAGNOSIS — R05 Cough: Secondary | ICD-10-CM | POA: Insufficient documentation

## 2012-07-06 DIAGNOSIS — R5383 Other fatigue: Secondary | ICD-10-CM | POA: Insufficient documentation

## 2012-07-06 DIAGNOSIS — F4310 Post-traumatic stress disorder, unspecified: Secondary | ICD-10-CM | POA: Insufficient documentation

## 2012-07-06 DIAGNOSIS — R197 Diarrhea, unspecified: Secondary | ICD-10-CM | POA: Insufficient documentation

## 2012-07-06 DIAGNOSIS — J189 Pneumonia, unspecified organism: Secondary | ICD-10-CM | POA: Insufficient documentation

## 2012-07-06 DIAGNOSIS — E559 Vitamin D deficiency, unspecified: Secondary | ICD-10-CM | POA: Insufficient documentation

## 2012-07-06 DIAGNOSIS — R059 Cough, unspecified: Secondary | ICD-10-CM | POA: Insufficient documentation

## 2012-07-06 DIAGNOSIS — R5381 Other malaise: Secondary | ICD-10-CM | POA: Insufficient documentation

## 2012-07-06 NOTE — ED Notes (Signed)
Pt's significant other stated she wanted to leave and go to another facility to be seen.  Stated that she did not want to wait any longer.

## 2012-07-06 NOTE — ED Notes (Signed)
Pt at desk demanding to speak to a patient advocate in reference to wait time. Informed patient that there is not a patient advocate here at this time, Silva Bandy charge RN notified of patient concerns.

## 2012-07-06 NOTE — ED Notes (Signed)
Pt reports being seen several times over the past week. Was diagnosed with pneumonia, was given prescription for antibiotic and iv antibiotics and still no relief. Having pain with cough, fatigue, n/v/d. Pt requesting admission. Airway intact at triage.

## 2012-10-03 ENCOUNTER — Other Ambulatory Visit: Payer: Self-pay | Admitting: Orthopedic Surgery

## 2012-10-03 DIAGNOSIS — M25512 Pain in left shoulder: Secondary | ICD-10-CM

## 2012-10-04 ENCOUNTER — Other Ambulatory Visit: Payer: Self-pay | Admitting: Orthopedic Surgery

## 2012-10-04 DIAGNOSIS — M25512 Pain in left shoulder: Secondary | ICD-10-CM

## 2012-10-09 ENCOUNTER — Other Ambulatory Visit: Payer: 59

## 2012-10-09 ENCOUNTER — Other Ambulatory Visit: Payer: Self-pay | Admitting: Orthopedic Surgery

## 2012-10-09 DIAGNOSIS — M25512 Pain in left shoulder: Secondary | ICD-10-CM

## 2012-10-10 ENCOUNTER — Ambulatory Visit
Admission: RE | Admit: 2012-10-10 | Discharge: 2012-10-10 | Disposition: A | Discharge status: Home / Self Care | Payer: 59 | Source: Ambulatory Visit | Attending: Orthopedic Surgery | Admitting: Orthopedic Surgery

## 2012-10-10 ENCOUNTER — Other Ambulatory Visit: Payer: 59

## 2012-10-10 DIAGNOSIS — M25512 Pain in left shoulder: Secondary | ICD-10-CM

## 2012-10-16 ENCOUNTER — Ambulatory Visit
Admission: RE | Admit: 2012-10-16 | Discharge: 2012-10-16 | Disposition: A | Discharge status: Home / Self Care | Payer: 59 | Source: Ambulatory Visit | Attending: Orthopedic Surgery | Admitting: Orthopedic Surgery

## 2012-10-16 DIAGNOSIS — M25512 Pain in left shoulder: Secondary | ICD-10-CM

## 2012-10-16 MED ORDER — IOHEXOL 180 MG/ML  SOLN
15.0000 mL | Freq: Once | INTRAMUSCULAR | Status: AC | PRN
  Administered 2012-10-16: 15 mL via INTRA_ARTICULAR

## 2013-01-15 ENCOUNTER — Observation Stay (HOSPITAL_COMMUNITY)
Admission: EM | Admit: 2013-01-15 | Discharge: 2013-01-16 | Disposition: A | Discharge status: Home / Self Care | Payer: 59 | Attending: Family Medicine | Admitting: Family Medicine

## 2013-01-15 ENCOUNTER — Encounter (HOSPITAL_COMMUNITY): Payer: Self-pay | Admitting: Emergency Medicine

## 2013-01-15 ENCOUNTER — Emergency Department (HOSPITAL_COMMUNITY): Payer: 59

## 2013-01-15 DIAGNOSIS — F329 Major depressive disorder, single episode, unspecified: Secondary | ICD-10-CM | POA: Insufficient documentation

## 2013-01-15 DIAGNOSIS — R079 Chest pain, unspecified: Secondary | ICD-10-CM

## 2013-01-15 DIAGNOSIS — R0789 Other chest pain: Principal | ICD-10-CM | POA: Diagnosis present

## 2013-01-15 DIAGNOSIS — F411 Generalized anxiety disorder: Secondary | ICD-10-CM | POA: Diagnosis present

## 2013-01-15 DIAGNOSIS — F3289 Other specified depressive episodes: Secondary | ICD-10-CM | POA: Insufficient documentation

## 2013-01-15 DIAGNOSIS — F4310 Post-traumatic stress disorder, unspecified: Secondary | ICD-10-CM | POA: Diagnosis present

## 2013-01-15 HISTORY — DX: Low back pain, unspecified: M54.50

## 2013-01-15 HISTORY — DX: Angina pectoris, unspecified: I20.9

## 2013-01-15 HISTORY — DX: Other chronic pain: G89.29

## 2013-01-15 HISTORY — DX: Pneumonia, unspecified organism: J18.9

## 2013-01-15 HISTORY — DX: Headache, unspecified: R51.9

## 2013-01-15 HISTORY — DX: Anxiety disorder, unspecified: F41.9

## 2013-01-15 HISTORY — DX: Headache: R51

## 2013-01-15 HISTORY — DX: Low back pain: M54.5

## 2013-01-15 LAB — CREATININE, SERUM
GFR calc Af Amer: >90 mL/min (ref >90)
GFR calc non Af Amer: >90 mL/min (ref >90)

## 2013-01-15 LAB — HEMOGLOBIN A1C: Hgb A1c MFr Bld: 5.4 % (ref <5.7)

## 2013-01-15 LAB — CBC
HCT: 35.8 % — ABNORMAL LOW (ref 36.0–46.0)
HCT: 34.7 % — ABNORMAL LOW (ref 36.0–46.0)
Hemoglobin: 12.2 g/dL (ref 12.0–15.0)
Platelets: 231 10*3/uL (ref 150–400)
RBC: 4.66 MIL/uL (ref 3.87–5.11)
RBC: 4.52 MIL/uL (ref 3.87–5.11)
RDW: 15.5 % (ref 11.5–15.5)
WBC: 4.1 10*3/uL (ref 4.0–10.5)
WBC: 4.6 10*3/uL (ref 4.0–10.5)

## 2013-01-15 LAB — BASIC METABOLIC PANEL
CO2: 27 mEq/L (ref 19–32)
Chloride: 101 mEq/L (ref 96–112)
GFR calc non Af Amer: >90 mL/min (ref >90)
Glucose, Bld: 98 mg/dL (ref 70–99)
Potassium: 4.1 mEq/L (ref 3.5–5.1)
Sodium: 136 mEq/L (ref 135–145)

## 2013-01-15 LAB — TROPONIN I: Troponin I: <0.3 ng/mL (ref <0.30)

## 2013-01-15 LAB — POCT I-STAT TROPONIN I

## 2013-01-15 MED ORDER — ZOLPIDEM TARTRATE 5 MG PO TABS: 5.0000 mg | ORAL_TABLET | Freq: Every evening | ORAL | Status: DC | PRN

## 2013-01-15 MED ORDER — MORPHINE SULFATE 4 MG/ML IJ SOLN
4.0000 mg | Freq: Once | INTRAMUSCULAR | Status: AC
  Administered 2013-01-15: 4 mg via INTRAVENOUS
  Filled 2013-01-15: qty 1

## 2013-01-15 MED ORDER — ONDANSETRON HCL 4 MG/2ML IJ SOLN
4.0000 mg | Freq: Once | INTRAMUSCULAR | Status: AC
  Administered 2013-01-15: 4 mg via INTRAVENOUS
  Filled 2013-01-15: qty 2

## 2013-01-15 MED ORDER — PANTOPRAZOLE SODIUM 40 MG PO TBEC
40.0000 mg | DELAYED_RELEASE_TABLET | Freq: Every day | ORAL | Status: DC
  Administered 2013-01-15: 40 mg via ORAL
  Filled 2013-01-15: qty 1

## 2013-01-15 MED ORDER — SODIUM CHLORIDE 0.9 % IJ SOLN
3.0000 mL | Freq: Two times a day (BID) | INTRAMUSCULAR | Status: DC
  Administered 2013-01-16: 3 mL via INTRAVENOUS

## 2013-01-15 MED ORDER — FLUOXETINE HCL 10 MG PO CAPS
10.0000 mg | ORAL_CAPSULE | Freq: Every day | ORAL | Status: DC
  Administered 2013-01-15 – 2013-01-16 (×2): 10 mg via ORAL
  Filled 2013-01-15 (×2): qty 1

## 2013-01-15 MED ORDER — ASPIRIN EC 81 MG PO TBEC
81.0000 mg | DELAYED_RELEASE_TABLET | Freq: Every day | ORAL | Status: DC
  Administered 2013-01-16: 81 mg via ORAL
  Filled 2013-01-15: qty 1

## 2013-01-15 MED ORDER — SODIUM CHLORIDE 0.9 % IV SOLN
INTRAVENOUS | Status: AC
  Administered 2013-01-15: 14:00:00 via INTRAVENOUS

## 2013-01-15 MED ORDER — ACETAMINOPHEN 325 MG PO TABS
650.0000 mg | ORAL_TABLET | Freq: Four times a day (QID) | ORAL | Status: DC | PRN
  Administered 2013-01-15 – 2013-01-16 (×2): 650 mg via ORAL
  Filled 2013-01-15 (×2): qty 2

## 2013-01-15 MED ORDER — ONDANSETRON HCL 4 MG/2ML IJ SOLN: 4.0000 mg | Freq: Four times a day (QID) | INTRAMUSCULAR | Status: DC | PRN

## 2013-01-15 MED ORDER — ENOXAPARIN SODIUM 40 MG/0.4ML ~~LOC~~ SOLN
40.0000 mg | SUBCUTANEOUS | Status: DC
  Administered 2013-01-15: 40 mg via SUBCUTANEOUS
  Filled 2013-01-15 (×2): qty 0.4

## 2013-01-15 MED ORDER — LORATADINE 10 MG PO TABS
10.0000 mg | ORAL_TABLET | Freq: Once | ORAL | Status: AC
  Administered 2013-01-15: 10 mg via ORAL
  Filled 2013-01-15: qty 1

## 2013-01-15 MED ORDER — ASPIRIN 81 MG PO CHEW
324.0000 mg | CHEWABLE_TABLET | Freq: Once | ORAL | Status: AC
  Administered 2013-01-15: 324 mg via ORAL
  Filled 2013-01-15: qty 1
  Filled 2013-01-15: qty 3

## 2013-01-15 MED ORDER — ONDANSETRON HCL 4 MG PO TABS: 4.0000 mg | ORAL_TABLET | Freq: Four times a day (QID) | ORAL | Status: DC | PRN

## 2013-01-15 MED ORDER — GI COCKTAIL ~~LOC~~
30.0000 mL | Freq: Once | ORAL | Status: AC
  Administered 2013-01-15: 30 mL via ORAL
  Filled 2013-01-15: qty 30

## 2013-01-15 MED ORDER — LORATADINE 10 MG PO TABS
10.0000 mg | ORAL_TABLET | Freq: Every day | ORAL | Status: DC
  Administered 2013-01-16: 10 mg via ORAL
  Filled 2013-01-15: qty 1

## 2013-01-15 MED ORDER — ENOXAPARIN SODIUM 40 MG/0.4ML ~~LOC~~ SOLN
40.0000 mg | SUBCUTANEOUS | Status: DC
  Filled 2013-01-15: qty 0.4

## 2013-01-15 MED ORDER — GABAPENTIN 100 MG PO CAPS
100.0000 mg | ORAL_CAPSULE | Freq: Three times a day (TID) | ORAL | Status: DC
  Administered 2013-01-15 – 2013-01-16 (×4): 100 mg via ORAL
  Filled 2013-01-15 (×5): qty 1

## 2013-01-15 MED ORDER — ALBUTEROL SULFATE HFA 108 (90 BASE) MCG/ACT IN AERS: 2.0000 | INHALATION_SPRAY | Freq: Four times a day (QID) | RESPIRATORY_TRACT | Status: DC | PRN

## 2013-01-15 MED ORDER — ACETAMINOPHEN 650 MG RE SUPP: 650.0000 mg | Freq: Four times a day (QID) | RECTAL | Status: DC | PRN

## 2013-01-15 NOTE — H&P (Signed)
[facility] History and Physical  Monique Acosta ZOX:[id number] DOB: 01-21-1976 DOA: 01/15/2013  Referring physician: Dr Blinda Leatherwood PCP: Provider Not In System  Specialists: [facility]  Chief Complaint: chest pain this morning.   HPI: Monique Acosta is a 37 y.o. female WITH prior h/o depression anxiety,  Back surgery, PTSD, on paxil came in for substernal and right sided chest pain while trying to dress, resolved by itself and occurred few minutes later when she was walking to the door. She called her daughter and came to ED,. She reports associated diaphoresis and anxiety. Pain is 5/10 when it happens, non radiating, not associated with sob, nausea or vomiting, no h/o syncope, orthopnea, pnd, or pedal edema. Family h/o of heart disease in father at young age of 44's. She is not on aspirin. No cough or fever or chills. She is chest pain free right now, and is being admitted to medical service for evaluation of ACS.   Review of Systems: The patient denies anorexia, fever, weight loss,, vision loss, decreased hearing, hoarseness, , syncope, dyspnea on exertion, peripheral edema, balance deficits, hemoptysis, abdominal pain, melena, hematochezia, severe indigestion/heartburn, hematuria, incontinence, genital sores, muscle weakness, suspicious skin lesions, transient blindness, difficulty walking,  unusual weight change, abnormal bleeding, enlarged lymph nodes, angioedema, and breast masses.    Past Medical History  Diagnosis Date  . Vitamin D deficiency   . PTSD (post-traumatic stress disorder)   . Anxiety    Past Surgical History  Procedure Laterality Date  . Back surgery    . Knee surgery    . Shoulder surgery    . Tubal ligation     Social History:  reports that she has never smoked. She does not have any smokeless tobacco history on file. She reports that  drinks alcohol. She reports that she does not use illicit drugs. where does patient live--home  Allergies  Allergen  Reactions  . Bee Venom Anaphylaxis  . Imitrex (Sumatriptan) Anaphylaxis  . Other Hives, Nausea And Vomiting and Rash    All antibiotics  . Azithromycin Hives, Nausea And Vomiting and Rash  . Clindamycin/Lincomycin Hives, Nausea And Vomiting and Rash  . Keflex (Cephalexin) Hives, Nausea And Vomiting and Rash  . Macrodantin (Nitrofurantoin Macrocrystal) Hives, Nausea And Vomiting and Rash  . Penicillins Hives, Nausea And Vomiting and Rash  . Sulfa Antibiotics Hives, Nausea And Vomiting and Rash    History reviewed. No pertinent family history.  Father had stroke in 72's. Father had an MI in 70's Mother has emphysema Grandmother has heart disease.  Prior to Admission medications   Medication Sig Start Date End Date Taking? Authorizing Provider  albuterol (PROVENTIL HFA;VENTOLIN HFA) 108 (90 BASE) MCG/ACT inhaler Inhale 2 puffs into the lungs every 6 (six) hours as needed for wheezing.   Yes Historical Provider, MD  Aspirin-Salicylamide-Caffeine (BC HEADACHE POWDER PO) Take 2 Packages by mouth 3 (three) times daily as needed. For headaches   Yes Historical Provider, MD  cetirizine (ZYRTEC) 10 MG tablet Take 10 mg by mouth daily.   Yes Historical Provider, MD  diclofenac sodium (VOLTAREN) 1 % GEL Apply 2 g topically 4 (four) times daily as needed (for muscle pain).   Yes Historical Provider, MD  FLUoxetine (PROZAC) 10 MG capsule Take 10 mg by mouth daily.   Yes Historical Provider, MD  gabapentin (NEURONTIN) 100 MG capsule Take 100 mg by mouth 3 (three) times daily.   Yes Historical Provider, MD  methocarbamol (ROBAXIN) 500 MG tablet Take 500 mg  by mouth 3 (three) times daily as needed (for muscle spasms).   Yes Historical Provider, MD  Probiotic Product (PROBIOTIC DAILY PO) Take 1 tablet by mouth daily.   Yes Historical Provider, MD  zolpidem (AMBIEN) 10 MG tablet Take 10 mg by mouth at bedtime as needed for sleep.    Yes Historical Provider, MD   Physical Exam: Filed Vitals:   01/15/13  1300 01/15/13 1315 01/15/13 1355 01/15/13 1512  BP: 115/81   113/82  Pulse: 77 76  81  Temp:    97.4 F (36.3 C)  TempSrc:    Oral  Resp:   16 20  Height:    5\' 5"  (1.651 m)  Weight:    88.497 kg (195 lb 1.6 oz)  SpO2: 100% 100%  97%    Constitutional: Vital signs reviewed.  Patient is a well-developed and well-nourished  in no acute distress and cooperative with exam. Alert and oriented x3.  Head: Normocephalic and atraumatic Mouth: no erythema or exudates, MMM Eyes: PERRL, EOMI, conjunctivae normal, No scleral icterus.  Neck: Supple, Trachea midline normal ROM, No JVD, mass, thyromegaly, or carotid bruit present.  Cardiovascular: RRR, S1 normal, S2 normal, no MRG, pulses symmetric and intact bilaterally Pulmonary/Chest: normal respiratory effort, CTAB, no wheezes, rales, or rhonchi Abdominal: Soft. Non-tender, non-distended, bowel sounds are normal, no masses, organomegaly, or guarding present.  Musculoskeletal: No joint deformities, erythema, or stiffness, ROM full and no nontender Neurological: A&O x3, Strength is normal and symmetric bilaterally,  no focal motor deficit, sensory intact to light touch bilaterally.  Skin: Warm, dry and intact. No rash, cyanosis, or clubbing.  Psychiatric: Normal mood and affect. speech and behavior is normal. Labs on Admission:  Basic Metabolic Panel:  Recent Labs Lab 01/15/13 1207  NA 136  K 4.1  CL 101  CO2 27  GLUCOSE 98  BUN 10  CREATININE 0.54  CALCIUM 9.7   Liver Function Tests: No results found for this basename: AST, ALT, ALKPHOS, BILITOT, PROT, ALBUMIN,  in the last 168 hours No results found for this basename: LIPASE, AMYLASE,  in the last 168 hours No results found for this basename: AMMONIA,  in the last 168 hours CBC:  Recent Labs Lab 01/15/13 1207  WBC 4.1  HGB 12.2  HCT 35.8*  MCV 76.8*  PLT 231   Cardiac Enzymes:  Recent Labs Lab 01/15/13 1443  TROPONINI <0.30    BNP (last 3 results)  Recent Labs   07/01/12 2011  PROBNP 15.5   CBG: No results found for this basename: GLUCAP,  in the last 168 hours  Radiological Exams on Admission: Dg Chest 2 View  01/15/2013   *RADIOLOGY REPORT*  Clinical Data: Chest pain  CHEST - 2 VIEW  Comparison: Chest x-ray of 07/01/2012  Findings: No active infiltrate or effusion is seen.  Minimally prominent peribronchial thickening is seen which could indicate bronchitis.  The heart is within normal limits in size.  No bony abnormality is seen.  IMPRESSION: No active lung disease.  Question bronchitis.   Original Report Authenticated By: Dwyane Dee, M.D.    EKG: NSR 96/min  Assessment/Plan Active Problems: 1. Atypical chest pain:  - admit to telemetry, get serial cadiac enzymes, EKG in am and echocardiogram .  - GI cocktail did not offer any relief, and no tenderness to palpation.  - she denies any heartburn.  - resume home medications and add aspirin.  - lipid panel and hgba1c and tsh ordered.   2. PTSD: resume home  medications.   3. DVT prophylaxis.   Code Status: full code/ Family Communication: no family at bedside.  Disposition Plan: home when stable  Time spent: 80 min  Monique Acosta [facility] Pager [phone]  If 7PM-7AM, please contact night-coverage [email or web address] Password [facility] 01/15/2013, 3:39 PM

## 2013-01-15 NOTE — Progress Notes (Signed)
Utilization review completed.  

## 2013-01-15 NOTE — ED Provider Notes (Signed)
History     CSN: [id number]  Arrival date & time 01/15/13  1016   First MD Initiated Contact with Patient 01/15/13 1150      Chief Complaint  Patient presents with  . Chest Pain    (Consider location/radiation/quality/duration/timing/severity/associated sxs/prior treatment) HPI  Monique Acosta is a 37 y.o.female presenting to the ER with complaints of chest pains that started this morning. She said that while she was getting dressed a very sharp substernal pain started that was paralyzing then it went away. Then as she was walking to the door it started again and she became diaphoretic and nauseous. She said that pain has greatly improved but is still present. She admits to having anxiety but says this does not feel the same. Had pneumonia and the flu at the end of 2013. Pt says she is convinced something very wrong is happening in her body. PMH + for PTSD and anxiety. Denies family hx.   Past Medical History  Diagnosis Date  . Vitamin D deficiency   . PTSD (post-traumatic stress disorder)   . Anxiety     Past Surgical History  Procedure Laterality Date  . Back surgery    . Knee surgery    . Shoulder surgery    . Tubal ligation      History reviewed. No pertinent family history.  History  Substance Use Topics  . Smoking status: Never Smoker   . Smokeless tobacco: Not on file  . Alcohol Use: Yes    OB History   Grav Para Term Preterm Abortions TAB SAB Ect Mult Living                  Review of Systems  Constitutional: Positive for diaphoresis.  Cardiovascular: Positive for chest pain.  All other systems reviewed and are negative.    Allergies  Bee venom; Imitrex; Other; Azithromycin; Clindamycin/lincomycin; Keflex; Macrodantin; Penicillins; and Sulfa antibiotics  Home Medications   Current Outpatient Rx  Name  Route  Sig  Dispense  Refill  . albuterol (PROVENTIL HFA;VENTOLIN HFA) 108 (90 BASE) MCG/ACT inhaler   Inhalation   Inhale 2 puffs into the lungs  every 6 (six) hours as needed for wheezing.         . Aspirin-Salicylamide-Caffeine (BC HEADACHE POWDER PO)   Oral   Take 2 Packages by mouth 3 (three) times daily as needed. For headaches         . cetirizine (ZYRTEC) 10 MG tablet   Oral   Take 10 mg by mouth daily.         . diclofenac sodium (VOLTAREN) 1 % GEL   Topical   Apply 2 g topically 4 (four) times daily as needed (for muscle pain).         Marland Kitchen FLUoxetine (PROZAC) 10 MG capsule   Oral   Take 10 mg by mouth daily.         Marland Kitchen gabapentin (NEURONTIN) 100 MG capsule   Oral   Take 100 mg by mouth 3 (three) times daily.         . methocarbamol (ROBAXIN) 500 MG tablet   Oral   Take 500 mg by mouth 3 (three) times daily as needed (for muscle spasms).         . Probiotic Product (PROBIOTIC DAILY PO)   Oral   Take 1 tablet by mouth daily.         Marland Kitchen zolpidem (AMBIEN) 10 MG tablet   Oral   Take 10 mg  by mouth at bedtime as needed for sleep.            BP 141/101  Pulse 96  Temp(Src) 98.1 F (36.7 C) (Oral)  Resp 26  SpO2 98%  Physical Exam  Nursing note and vitals reviewed. Constitutional: She appears well-developed and well-nourished. No distress.  HENT:  Head: Normocephalic and atraumatic.  Eyes: Pupils are equal, round, and reactive to light.  Neck: Normal range of motion. Neck supple.  Cardiovascular: Normal rate and regular rhythm.   Pulmonary/Chest: Effort normal.  Abdominal: Soft.  Neurological: She is alert.  Skin: Skin is warm and dry.  Psychiatric: Her mood appears anxious.    ED Course  Procedures (including critical care time)  Labs Reviewed  CBC - Abnormal; Notable for the following:    HCT 35.8 (*)    MCV 76.8 (*)    All other components within normal limits  BASIC METABOLIC PANEL  POCT I-STAT TROPONIN I   Dg Chest 2 View  01/15/2013   *RADIOLOGY REPORT*  Clinical Data: Chest pain  CHEST - 2 VIEW  Comparison: Chest x-ray of 07/01/2012  Findings: No active infiltrate or  effusion is seen.  Minimally prominent peribronchial thickening is seen which could indicate bronchitis.  The heart is within normal limits in size.  No bony abnormality is seen.  IMPRESSION: No active lung disease.  Question bronchitis.   Original Report Authenticated By: Dwyane Dee, M.D.     1. Chest pain       MDM   Date: 01/15/2013  Rate: 93  Rhythm: normal sinus rhythm  QRS Axis: normal  Intervals: normal  ST/T Wave abnormalities: normal  Conduction Disutrbances:none  Narrative Interpretation:   Old EKG Reviewed: unchanged from Jul 01, 2012  Patients work-up normal in ED with negative trop. Pt very anxious, will admit her for obs to cycle enzymes.  Pt admitted to [facility], MC, Tele, Obs         Dorthula Matas, [place] 01/15/13 1342

## 2013-01-15 NOTE — ED Notes (Addendum)
Pt c/o mid sternal CP worse with inspiration and movement; pt appears anxious at present; pt recently had medication change 3 days ago

## 2013-01-16 DIAGNOSIS — I517 Cardiomegaly: Secondary | ICD-10-CM

## 2013-01-16 DIAGNOSIS — R079 Chest pain, unspecified: Secondary | ICD-10-CM

## 2013-01-16 LAB — LIPID PANEL
HDL: 42 mg/dL (ref >39)
LDL Cholesterol: 86 mg/dL (ref 0–99)
Triglycerides: 147 mg/dL (ref <150)
VLDL: 29 mg/dL (ref 0–40)

## 2013-01-16 MED ORDER — IBUPROFEN 400 MG PO TABS
400.0000 mg | ORAL_TABLET | Freq: Four times a day (QID) | ORAL | Status: DC
  Administered 2013-01-16: 400 mg via ORAL
  Filled 2013-01-16 (×4): qty 1

## 2013-01-16 MED ORDER — PANTOPRAZOLE SODIUM 40 MG PO TBEC: 40.0000 mg | DELAYED_RELEASE_TABLET | Freq: Every day | ORAL | Status: DC

## 2013-01-16 MED ORDER — ASPIRIN 81 MG PO CHEW
CHEWABLE_TABLET | ORAL | Status: AC
  Administered 2013-01-16: 12:00:00
  Filled 2013-01-16: qty 1

## 2013-01-16 NOTE — Discharge Summary (Signed)
Physician Discharge Summary  Monique Acosta GEX:[id number] DOB: 08/26/1975 DOA: 01/15/2013  PCP: Leo Grosser, MD  Admit date: 01/15/2013 Discharge date: 01/16/2013  Time spent: 35 minutes  Recommendations for Outpatient Follow-up:  1. Followup with primary care physician 2. Get a be met him his metabolic panel about a week 3. Needs outpatient cardiac followup have referred patient to Dr. Anne Fu  4. Followup echocardiogram as an outpatient  Discharge Diagnoses:  Active Problems:   Atypical chest pain   Anxiety state, unspecified   Depression   PTSD (post-traumatic stress disorder)   Discharge Condition: Stable  Diet recommendation: Regular  Filed Weights   01/15/13 1512 01/16/13 0124  Weight: 88.497 kg (195 lb 1.6 oz) 89.767 kg (197 lb 14.4 oz)    History of present illness:  37 year old correct view history PTSD and anxiety and L4 better and presented to the emergency room 6-11 2014 with some chest pain. She states she is trying to dress and felt diaphoretic anxious and had a knot in her chest. She was admitted overnight and ruled out by cardiac enzymes. Because of her cardiac history, echocardiograms was performed She states that she goes to the [facility] in [place] and was assessed there for potential anxiety started on Prozac 30 days ago but did not feel well on this and the dose is decreased to 10 mg daily. I have recommended to her to followup as an outpatient with Dr. Gailen Shelter or the [facility] and maybe then we can determine the best course of action for with regards to other medications at this time.  Discharge Exam: Filed Vitals:   01/16/13 0124 01/16/13 0631 01/16/13 1003 01/16/13 1355  BP:  100/64 115/81 127/85  Pulse:  78 78 78  Temp:  97.5 F (36.4 C) 97.9 F (36.6 C) 98.2 F (36.8 C)  TempSrc:  Oral    Resp:  20 18 18   Height:      Weight: 89.767 kg (197 lb 14.4 oz)     SpO2:  99% 100% 100%   Doing well no apparent distress States the symptoms that she had had  now gone Tolerating by mouth well  General: EOMI Cardiovascular: S1-S2 no murmur rub or gallop Respiratory: Clinically clear  Discharge Instructions  Discharge Orders   Future Orders Complete By Expires     Diet - low sodium heart healthy  As directed     Discharge instructions  As directed     Comments:      you more likely than not did not have cardiac disease To make sure of this I have referred you to a cardiologist as per above. Please followup with him to get a stress test done He will probably need some help with your PTSD and anxiety disorder and hopefully Dr. Gailen Shelter can help coordinate this    Increase activity slowly  As directed         Medication List    STOP taking these medications       BC HEADACHE POWDER PO      TAKE these medications       albuterol 108 (90 BASE) MCG/ACT inhaler  Commonly known as:  PROVENTIL HFA;VENTOLIN HFA  Inhale 2 puffs into the lungs every 6 (six) hours as needed for wheezing.     cetirizine 10 MG tablet  Commonly known as:  ZYRTEC  Take 10 mg by mouth daily.     diclofenac sodium 1 % Gel  Commonly known as:  VOLTAREN  Apply 2 g topically 4 (  four) times daily as needed (for muscle pain).     FLUoxetine 10 MG capsule  Commonly known as:  PROZAC  Take 10 mg by mouth daily.     gabapentin 100 MG capsule  Commonly known as:  NEURONTIN  Take 100 mg by mouth 3 (three) times daily.     methocarbamol 500 MG tablet  Commonly known as:  ROBAXIN  Take 500 mg by mouth 3 (three) times daily as needed (for muscle spasms).     pantoprazole 40 MG tablet  Commonly known as:  PROTONIX  Take 1 tablet (40 mg total) by mouth daily at 6 (six) AM.     PROBIOTIC DAILY PO  Take 1 tablet by mouth daily.     zolpidem 10 MG tablet  Commonly known as:  AMBIEN  Take 10 mg by mouth at bedtime as needed for sleep.       Allergies  Allergen Reactions  . Bee Venom Anaphylaxis  . Imitrex (Sumatriptan) Anaphylaxis  . Other Hives, Nausea And  Vomiting and Rash    All antibiotics  . Azithromycin Hives, Nausea And Vomiting and Rash  . Clindamycin/Lincomycin Hives, Nausea And Vomiting and Rash  . Keflex (Cephalexin) Hives, Nausea And Vomiting and Rash  . Macrodantin (Nitrofurantoin Macrocrystal) Hives, Nausea And Vomiting and Rash  . Penicillins Hives, Nausea And Vomiting and Rash  . Sulfa Antibiotics Hives, Nausea And Vomiting and Rash       Follow-up Information   Follow up with Leo Grosser, MD.   Contact information:   [address] [place] [address] [phone]       Follow up with Donato Schultz, MD. (see him for cardiolgoy care)    Contact information:   [address] [place] [address] [phone]        The results of significant diagnostics from this hospitalization (including imaging, microbiology, ancillary and laboratory) are listed below for reference.    Significant Diagnostic Studies: Dg Chest 2 View  01/15/2013   *RADIOLOGY REPORT*  Clinical Data: Chest pain  CHEST - 2 VIEW  Comparison: Chest x-ray of 07/01/2012  Findings: No active infiltrate or effusion is seen.  Minimally prominent peribronchial thickening is seen which could indicate bronchitis.  The heart is within normal limits in size.  No bony abnormality is seen.  IMPRESSION: No active lung disease.  Question bronchitis.   Original Report Authenticated By: Dwyane Dee, M.D.    Microbiology: No results found for this or any previous visit (from the past 240 hour(s)).   Labs: Basic Metabolic Panel:  Recent Labs Lab 01/15/13 1207 01/15/13 1635  NA 136  --   K 4.1  --   CL 101  --   CO2 27  --   GLUCOSE 98  --   BUN 10  --   CREATININE 0.54 0.61  CALCIUM 9.7  --    Liver Function Tests: No results found for this basename: AST, ALT, ALKPHOS, BILITOT, PROT, ALBUMIN,  in the last 168 hours No results found for this basename: LIPASE, AMYLASE,  in the last 168 hours No results found for this basename:  AMMONIA,  in the last 168 hours CBC:  Recent Labs Lab 01/15/13 1207 01/15/13 1635  WBC 4.1 4.6  HGB 12.2 11.7*  HCT 35.8* 34.7*  MCV 76.8* 76.8*  PLT 231 231   Cardiac Enzymes:  Recent Labs Lab 01/15/13 1443 01/15/13 2211 01/16/13 0454  TROPONINI <0.30 <0.30 <0.30   BNP: BNP (last  3 results)  Recent Labs  07/01/12 2011  PROBNP 15.5   CBG: No results found for this basename: GLUCAP,  in the last 168 hours     Signed:  Rhetta Mura  [facility] 01/16/2013, 3:26 PM

## 2013-01-16 NOTE — ED Provider Notes (Signed)
Medical screening examination/treatment/procedure(s) were performed by non-physician practitioner and as supervising physician I was immediately available for consultation/collaboration.   Afiya Ferrebee J. Wilberto Console, MD 01/16/13 0707 

## 2013-01-16 NOTE — Progress Notes (Signed)
  Echocardiogram 2D Echocardiogram has been performed.  Monique Acosta 01/16/2013, 9:56 AM 

## 2013-03-24 ENCOUNTER — Ambulatory Visit (INDEPENDENT_AMBULATORY_CARE_PROVIDER_SITE_OTHER): Payer: 59 | Admitting: Physician Assistant

## 2013-03-24 ENCOUNTER — Encounter: Payer: Self-pay | Admitting: Physician Assistant

## 2013-03-24 VITALS — BP 124/86 | HR 72 | Temp 98.2°F | Resp 18 | Ht 65.25 in | Wt 198.0 lb

## 2013-03-24 DIAGNOSIS — Z23 Encounter for immunization: Secondary | ICD-10-CM

## 2013-03-24 DIAGNOSIS — Z9189 Other specified personal risk factors, not elsewhere classified: Secondary | ICD-10-CM

## 2013-03-24 NOTE — Progress Notes (Signed)
Patient ID: Monique Acosta MRN: [id number], DOB: [birth year], 37 y.o. Date of Encounter: 03/24/2013, 1:54 PM    Chief Complaint:  Chief Complaint  Patient presents with  . needs immunization record    needs for college  no idea where records could be  on three new meds but does not know what they are  new antidepressant and two anti anxiety meds     HPI: 37 y.o. year old AA female here regarding immunization records needed for her college. She reports that her pediatrician used paper charts and her pediatrician has actually passed away. She is not sure where she can find a copy of her immunization records. She has a form with her with the requirements that are needed for [employer]. She has no other complaints. She did inform me that she had to go to the hospital in June because she was having chest pain. However this ended up being secondary to anxiety. She says that she is being being treated by psychiatrist through the [facility]. Otherwise she has been in good health with no complaints today.     Home Meds: See attached medication section for any medications that were entered at today's visit. The computer does not put those onto this list.The following list is a list of meds entered prior to today's visit.   Current Outpatient Prescriptions on File Prior to Visit  Medication Sig Dispense Refill  . albuterol (PROVENTIL HFA;VENTOLIN HFA) 108 (90 BASE) MCG/ACT inhaler Inhale 2 puffs into the lungs every 6 (six) hours as needed for wheezing.      . cetirizine (ZYRTEC) 10 MG tablet Take 10 mg by mouth daily.      . diclofenac sodium (VOLTAREN) 1 % GEL Apply 2 g topically 4 (four) times daily as needed (for muscle pain).      Marland Kitchen gabapentin (NEURONTIN) 100 MG capsule Take 100 mg by mouth 3 (three) times daily.      . Probiotic Product (PROBIOTIC DAILY PO) Take 1 tablet by mouth daily.      Marland Kitchen zolpidem (AMBIEN) 10 MG tablet Take 10 mg by mouth at bedtime as needed for sleep.       Marland Kitchen  FLUoxetine (PROZAC) 10 MG capsule Take 10 mg by mouth daily.      . methocarbamol (ROBAXIN) 500 MG tablet Take 500 mg by mouth 3 (three) times daily as needed (for muscle spasms).      . pantoprazole (PROTONIX) 40 MG tablet Take 1 tablet (40 mg total) by mouth daily at 6 (six) AM.  30 tablet  0   No current facility-administered medications on file prior to visit.    Allergies:  Allergies  Allergen Reactions  . Bee Venom Anaphylaxis  . Imitrex [Sumatriptan] Anaphylaxis  . Other Hives, Nausea And Vomiting and Rash    All antibiotics  . Azithromycin Hives, Nausea And Vomiting and Rash  . Clindamycin/Lincomycin Hives, Nausea And Vomiting and Rash  . Keflex [Cephalexin] Hives, Nausea And Vomiting and Rash  . Macrodantin [Nitrofurantoin Macrocrystal] Hives, Nausea And Vomiting and Rash  . Penicillins Hives, Nausea And Vomiting and Rash  . Sulfa Antibiotics Hives, Nausea And Vomiting and Rash      Review of Systems: See HPI for pertinent ROS. All other ROS negative.    Physical Exam: Blood pressure 124/86, pulse 72, temperature 98.2 F (36.8 C), temperature source Oral, resp. rate 18, height 5' 5.25" (1.657 m), weight 198 lb (89.812 kg)., Body mass index is 32.71 kg/(m^2). General:  AAF. Appears in no acute distress. Lungs: Clear bilaterally to auscultation without wheezes, rales, or rhonchi. Breathing is unlabored. Heart: Regular rhythm. No murmurs, rubs, or gallops. Msk:  Strength and tone normal for age. Extremities/Skin: Warm and dry.  No edema. No rashes or suspicious lesions. Neuro: Alert and oriented X 3. Moves all extremities spontaneously. Gait is normal. CNII-XII grossly in tact. Psych:  Responds to questions appropriately with a normal affect.     ASSESSMENT AND PLAN:  37 y.o. year old female with  1. Immunizations /School Form:  She has no idea when her last tetanus shot was. She thinks it has been at least 5 years. We'll go ahead and update with a full T. Dap. We will  check titers for MMR as well as Polio. This will satisfy all the requirements on the form. - Measles/Mumps/Rubella Immunity - Viral titer, miscellaneous - Tdap vaccine greater than or equal to 7yo IM  2. Need for Tdap vaccination - Tdap vaccine greater than or equal to 7yo IM   Signed, [address] [place], [place], [facility] 03/24/2013 1:54 PM

## 2013-03-25 LAB — MEASLES/MUMPS/RUBELLA IMMUNITY: Rubeola IgG: 46.8 AU/mL — ABNORMAL HIGH (ref <25.00)

## 2013-03-28 LAB — VIRAL TITER, MISCELLANEOUS
Cytomegalovirus Ab-IgG: >10 U/mL — ABNORMAL HIGH (ref <0.60)
HSV 2 Glycoprotein G Ab, IgG: 10.42 IV — ABNORMAL HIGH
Poliovirus Type 3 Antibodies: 1:128 {titer}

## 2013-05-03 ENCOUNTER — Encounter (HOSPITAL_COMMUNITY): Payer: Self-pay | Admitting: Emergency Medicine

## 2013-05-03 ENCOUNTER — Emergency Department (HOSPITAL_COMMUNITY)
Admission: EM | Admit: 2013-05-03 | Discharge: 2013-05-04 | Disposition: A | Discharge status: Home / Self Care | Payer: 59 | Attending: Emergency Medicine | Admitting: Emergency Medicine

## 2013-05-03 ENCOUNTER — Emergency Department (HOSPITAL_COMMUNITY): Payer: 59

## 2013-05-03 DIAGNOSIS — Z8639 Personal history of other endocrine, nutritional and metabolic disease: Secondary | ICD-10-CM | POA: Insufficient documentation

## 2013-05-03 DIAGNOSIS — M7918 Myalgia, other site: Secondary | ICD-10-CM

## 2013-05-03 DIAGNOSIS — Y9389 Activity, other specified: Secondary | ICD-10-CM | POA: Insufficient documentation

## 2013-05-03 DIAGNOSIS — S0990XA Unspecified injury of head, initial encounter: Secondary | ICD-10-CM | POA: Insufficient documentation

## 2013-05-03 DIAGNOSIS — Z862 Personal history of diseases of the blood and blood-forming organs and certain disorders involving the immune mechanism: Secondary | ICD-10-CM | POA: Insufficient documentation

## 2013-05-03 DIAGNOSIS — R110 Nausea: Secondary | ICD-10-CM | POA: Insufficient documentation

## 2013-05-03 DIAGNOSIS — Z880 Allergy status to penicillin: Secondary | ICD-10-CM | POA: Insufficient documentation

## 2013-05-03 DIAGNOSIS — Y92410 Unspecified street and highway as the place of occurrence of the external cause: Secondary | ICD-10-CM | POA: Insufficient documentation

## 2013-05-03 DIAGNOSIS — Z8701 Personal history of pneumonia (recurrent): Secondary | ICD-10-CM | POA: Insufficient documentation

## 2013-05-03 DIAGNOSIS — S4980XA Other specified injuries of shoulder and upper arm, unspecified arm, initial encounter: Secondary | ICD-10-CM | POA: Insufficient documentation

## 2013-05-03 DIAGNOSIS — F411 Generalized anxiety disorder: Secondary | ICD-10-CM | POA: Insufficient documentation

## 2013-05-03 DIAGNOSIS — Z79899 Other long term (current) drug therapy: Secondary | ICD-10-CM | POA: Insufficient documentation

## 2013-05-03 DIAGNOSIS — S79919A Unspecified injury of unspecified hip, initial encounter: Secondary | ICD-10-CM | POA: Insufficient documentation

## 2013-05-03 DIAGNOSIS — S46909A Unspecified injury of unspecified muscle, fascia and tendon at shoulder and upper arm level, unspecified arm, initial encounter: Secondary | ICD-10-CM | POA: Insufficient documentation

## 2013-05-03 MED ORDER — ONDANSETRON 4 MG PO TBDP
8.0000 mg | ORAL_TABLET | Freq: Once | ORAL | Status: AC
  Administered 2013-05-03: 8 mg via ORAL
  Filled 2013-05-03: qty 2

## 2013-05-03 MED ORDER — HYDROCODONE-ACETAMINOPHEN 5-325 MG PO TABS
1.0000 | ORAL_TABLET | Freq: Once | ORAL | Status: AC
  Administered 2013-05-03: 1 via ORAL
  Filled 2013-05-03: qty 1
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
## 2013-05-04 MED ORDER — TRAMADOL HCL 50 MG PO TABS: 50.0000 mg | ORAL_TABLET | Freq: Four times a day (QID) | ORAL | Status: DC | PRN

## 2013-05-04 NOTE — ED Provider Notes (Signed)
Medical screening examination/treatment/procedure(s) were performed by non-physician practitioner and as supervising physician I was immediately available for consultation/collaboration.   [name], MD 05/04/13 [phone]

## 2013-05-06 ENCOUNTER — Encounter: Payer: Self-pay | Admitting: Family Medicine

## 2013-05-06 ENCOUNTER — Ambulatory Visit (INDEPENDENT_AMBULATORY_CARE_PROVIDER_SITE_OTHER): Payer: PRIVATE HEALTH INSURANCE | Admitting: Family Medicine

## 2013-05-06 DIAGNOSIS — M545 Low back pain: Secondary | ICD-10-CM

## 2013-05-06 DIAGNOSIS — G629 Polyneuropathy, unspecified: Secondary | ICD-10-CM

## 2013-05-06 DIAGNOSIS — G589 Mononeuropathy, unspecified: Secondary | ICD-10-CM

## 2013-05-06 MED ORDER — PREDNISONE 20 MG PO TABS: ORAL_TABLET | ORAL | Status: DC
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
## 2013-05-19 ENCOUNTER — Telehealth: Payer: Self-pay | Admitting: Family Medicine

## 2013-05-19 NOTE — Telephone Encounter (Signed)
Patient was told by [facility]  Dr. Claiborne Billings she needed a referral for her upper Rt shoulder. Please contact patient for more information if needed.

## 2013-05-21 NOTE — Telephone Encounter (Signed)
Per Dr. Tanya Nones ok to do referral. LM for pt to return call to let us know what she needs referral for? MRI,CT or ortho????

## 2013-05-28 NOTE — Telephone Encounter (Signed)
Called pt on another number that was given by her BF [phone], left message to return call

## 2013-07-21 ENCOUNTER — Encounter: Payer: Self-pay | Admitting: Family Medicine

## 2013-07-21 ENCOUNTER — Ambulatory Visit (INDEPENDENT_AMBULATORY_CARE_PROVIDER_SITE_OTHER): Payer: 59 | Admitting: Family Medicine

## 2013-07-21 VITALS — BP 118/80 | HR 78 | Temp 97.0°F | Resp 18 | Wt 192.0 lb

## 2013-07-21 DIAGNOSIS — M7581 Other shoulder lesions, right shoulder: Secondary | ICD-10-CM

## 2013-07-21 DIAGNOSIS — M67919 Unspecified disorder of synovium and tendon, unspecified shoulder: Secondary | ICD-10-CM

## 2013-07-21 DIAGNOSIS — J45909 Unspecified asthma, uncomplicated: Secondary | ICD-10-CM

## 2013-07-21 MED ORDER — PREDNISONE 20 MG PO TABS: ORAL_TABLET | ORAL | Status: DC
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
## 2013-07-27 ENCOUNTER — Inpatient Hospital Stay
Admission: RE | Admit: 2013-07-27 | Discharge: 2013-07-27 | Disposition: A | Discharge status: Home / Self Care | Payer: 59 | Source: Ambulatory Visit | Attending: Family Medicine | Admitting: Family Medicine

## 2013-08-16 ENCOUNTER — Inpatient Hospital Stay: Admission: RE | Admit: 2013-08-16 | Payer: 59 | Source: Ambulatory Visit

## 2013-08-31 ENCOUNTER — Emergency Department (HOSPITAL_COMMUNITY)
Admission: EM | Admit: 2013-08-31 | Discharge: 2013-08-31 | Disposition: A | Discharge status: Home / Self Care | Payer: PRIVATE HEALTH INSURANCE | Source: Home / Self Care | Attending: Family Medicine | Admitting: Family Medicine

## 2013-08-31 ENCOUNTER — Encounter (HOSPITAL_COMMUNITY): Payer: Self-pay | Admitting: Emergency Medicine

## 2013-08-31 DIAGNOSIS — J029 Acute pharyngitis, unspecified: Secondary | ICD-10-CM

## 2013-08-31 DIAGNOSIS — R319 Hematuria, unspecified: Secondary | ICD-10-CM

## 2013-08-31 DIAGNOSIS — R300 Dysuria: Secondary | ICD-10-CM

## 2013-08-31 HISTORY — DX: Polyneuropathy, unspecified: G62.9

## 2013-08-31 HISTORY — DX: Urinary tract infection, site not specified: N39.0

## 2013-08-31 LAB — POCT RAPID STREP A: STREPTOCOCCUS, GROUP A SCREEN (DIRECT): NEGATIVE

## 2013-08-31 LAB — POCT URINALYSIS DIP (DEVICE)
BILIRUBIN URINE: NEGATIVE
GLUCOSE, UA: NEGATIVE mg/dL
Ketones, ur: NEGATIVE mg/dL
NITRITE: NEGATIVE
Protein, ur: 30 mg/dL — AB
Specific Gravity, Urine: >=1.03 (ref 1.005–1.030)
UROBILINOGEN UA: 0.2 mg/dL (ref 0.0–1.0)
pH: 5.5 (ref 5.0–8.0)

## 2013-08-31 LAB — POCT PREGNANCY, URINE: PREG TEST UR: NEGATIVE

## 2013-08-31 NOTE — ED Notes (Signed)
C/O dysuria, cloudy urine, frequent urination, back pain x 1 wk without fevers.  Also c/o left throat pain.  Has been taking cranberry tablets and drinking cranberry juice.

## 2013-08-31 NOTE — Discharge Instructions (Signed)
Dysuria Dysuria is the medical term for pain with urination. There are many causes for dysuria, but urinary tract infection is the most common. If a urinalysis was performed it can show that there is a urinary tract infection. A urine culture confirms that you or your child is sick. You will need to follow up with a healthcare provider because:  If a urine culture was done you will need to know the culture results and treatment recommendations.  If the urine culture was positive, you or your child will need to be put on antibiotics or know if the antibiotics prescribed are the right antibiotics for your urinary tract infection.  If the urine culture is negative (no urinary tract infection), then other causes may need to be explored or antibiotics need to be stopped. Today laboratory work may have been done and there does not seem to be an infection. If cultures were done they will take at least 24 to 48 hours to be completed. Today x-rays may have been taken and they read as normal. No cause can be found for the problems. The x-rays may be re-read by a radiologist and you will be contacted if additional findings are made. You or your child may have been put on medications to help with this problem until you can see your primary caregiver. If the problems get better, see your primary caregiver if the problems return. If you were given antibiotics (medications which kill germs), take all of the mediations as directed for the full course of treatment.  If laboratory work was done, you need to find the results. Leave a telephone number where you can be reached. If this is not possible, make sure you find out how you are to get test results. HOME CARE INSTRUCTIONS   Drink lots of fluids. For adults, drink eight, 8 ounce glasses of clear juice or water a day. For children, replace fluids as suggested by your caregiver.  Empty the bladder often. Avoid holding urine for long periods of time.  After a bowel  movement, women should cleanse front to back, using each tissue only once.  Empty your bladder before and after sexual intercourse.  Take all the medicine given to you until it is gone. You may feel better in a few days, but TAKE ALL MEDICINE.  Avoid caffeine, tea, alcohol and carbonated beverages, because they tend to irritate the bladder.  In men, alcohol may irritate the prostate.  Only take over-the-counter or prescription medicines for pain, discomfort, or fever as directed by your caregiver.  If your caregiver has given you a follow-up appointment, it is very important to keep that appointment. Not keeping the appointment could result in a chronic or permanent injury, pain, and disability. If there is any problem keeping the appointment, you must call back to this facility for assistance. SEEK IMMEDIATE MEDICAL CARE IF:   Back pain develops.  A fever develops.  There is nausea (feeling sick to your stomach) or vomiting (throwing up).  Problems are no better with medications or are getting worse. MAKE SURE YOU:   Understand these instructions.  Will watch your condition.  Will get help right away if you are not doing well or get worse. Document Released: 04/21/2004 Document Revised: 10/16/2011 Document Reviewed: 02/27/2008 [facility] [place] Patient Information 2014 [place], [place].  As we discussed, I would consider discussing your concerns about your house with your landlord. As for your urine studies, you have blood in your urine and today's specimen will be held  for culture. Treatment will be determined based upon the results as you are allergic to the vast majority of the medications commonly used to treat urinary tract infections. On your discharge paperwork, you have listed two clinics you can consider for primary care.

## 2013-08-31 NOTE — ED Provider Notes (Signed)
CSN: [id number]     Arrival date & time 08/31/13  1047 History   First MD Initiated Contact with Patient 08/31/13 1141     Chief Complaint  Patient presents with  . Urinary Tract Infection  . Sore Throat   (Consider location/radiation/quality/duration/timing/severity/associated sxs/prior Treatment) HPI Comments: Patient presents with several complaints. First she states that she has had chronic difficulties with sore throat and wheezing since moving into a new home in October 2014. She has had multiple evaluations for same at local urgent cares and by her PCP. No diagnosis achieved. She would like for me to tell her if I think something in her house is causing her symptoms. States that she has already been schedule for neck/thyroid U/S by her PCP for March 2015. Next, she states she has had 6 days of waxing/waning dysuria without urgency, hematuria or frequency. Was seen by her PCP two days ago, but states she did not mention these symptoms to her provider at the time of her visit. Denies vaginal bleeding, discharge, abdominal pain, flank pain. No bowel issues.   The history is provided by the patient.    Past Medical History  Diagnosis Date  . Vitamin D deficiency   . Anxiety   . Anginal pain     "not until today" (01/15/2013)  . Pneumonia 06/2012  . Daily headache   . Chronic lower back pain   . PTSD (post-traumatic stress disorder)   . Frequent UTI   . Peripheral neuropathy    Past Surgical History  Procedure Laterality Date  . Back surgery    . Knee arthroscopy Left 1980's  . Shoulder arthroscopy w/ rotator cuff repair Left 1990's  . Tubal ligation  2008  . Replacement disc anterior lumbar spine  2009   No family history on file. History  Substance Use Topics  . Smoking status: Never Smoker   . Smokeless tobacco: Never Used  . Alcohol Use: Yes     Comment: occasional   OB History   Grav Para Term Preterm Abortions TAB SAB Ect Mult Living                 Review of  Systems  Constitutional: Negative.   HENT: Positive for congestion and sore throat. Negative for postnasal drip, rhinorrhea and trouble swallowing.   Eyes: Negative.   Respiratory: Negative.   Cardiovascular: Negative.   Gastrointestinal: Negative.   Endocrine: Negative.   Genitourinary: Positive for dysuria. Negative for urgency, frequency, hematuria, flank pain, decreased urine volume, vaginal bleeding, vaginal discharge, genital sores, vaginal pain and pelvic pain.  Musculoskeletal: Negative.   Allergic/Immunologic: Negative for immunocompromised state.  Neurological: Negative for dizziness, weakness, light-headedness, numbness and headaches.  Hematological: Negative.     Allergies  Bee venom; Imitrex; Other; Flagyl; Azithromycin; Clindamycin/lincomycin; Keflex; Macrodantin; Penicillins; and Sulfa antibiotics  Home Medications   Current Outpatient Rx  Name  Route  Sig  Dispense  Refill  . albuterol (PROVENTIL HFA;VENTOLIN HFA) 108 (90 BASE) MCG/ACT inhaler   Inhalation   Inhale 2 puffs into the lungs every 6 (six) hours as needed for wheezing.         . cetirizine (ZYRTEC) 10 MG tablet   Oral   Take 10 mg by mouth daily.         Marland Kitchen. zolpidem (AMBIEN) 10 MG tablet   Oral   Take 10 mg by mouth at bedtime as needed for sleep.          Marland Kitchen. diclofenac sodium (VOLTAREN)  1 % GEL   Topical   Apply 2 g topically 4 (four) times daily as needed (for muscle pain).         Marland Kitchen FLUoxetine (PROZAC) 10 MG capsule   Oral   Take 10 mg by mouth daily.         Marland Kitchen gabapentin (NEURONTIN) 100 MG capsule   Oral   Take 100 mg by mouth 2 (two) times daily.          . naproxen sodium (ANAPROX) 220 MG tablet   Oral   Take 220 mg by mouth 2 (two) times daily with a meal.         . predniSONE (DELTASONE) 20 MG tablet      3 tabs poqday 1-2, 2 tabs poqday 3-4, 1 tab poqday 5-6   12 tablet   0   . predniSONE (DELTASONE) 20 MG tablet      3 tabs poqday 1-2, 2 tabs poqday 3-4, 1 tab  poqday 5-6   12 tablet   0   . Probiotic Product (PROBIOTIC DAILY PO)   Oral   Take 1 tablet by mouth daily.         . traMADol (ULTRAM) 50 MG tablet   Oral   Take 1 tablet (50 mg total) by mouth every 6 (six) hours as needed for pain.   15 tablet   0    BP 116/81  Pulse 76  Temp(Src) 98 F (36.7 C) (Oral)  Resp 16  SpO2 100% Physical Exam  Nursing note and vitals reviewed. Constitutional: She is oriented to person, place, and time. She appears well-developed and well-nourished. No distress.  HENT:  Head: Normocephalic and atraumatic.  Eyes: Conjunctivae are normal. Right eye exhibits no discharge. Left eye exhibits no discharge. No scleral icterus.  Neck: Normal range of motion. Neck supple. No thyromegaly present.  Cardiovascular: Normal rate, regular rhythm and normal heart sounds.   Pulmonary/Chest: Effort normal and breath sounds normal. No respiratory distress. She has no wheezes.  Abdominal: There is no tenderness. There is no CVA tenderness.  Musculoskeletal: Normal range of motion.  Lymphadenopathy:    She has no cervical adenopathy.  Neurological: She is alert and oriented to person, place, and time.  Skin: Skin is warm and dry.  Psychiatric: She has a normal mood and affect. Her behavior is normal.    ED Course  Procedures (including critical care time) Labs Review Labs Reviewed  POCT URINALYSIS DIP (DEVICE) - Abnormal; Notable for the following:    Hgb urine dipstick LARGE (*)    Protein, ur 30 (*)    Leukocytes, UA SMALL (*)    All other components within normal limits  POCT RAPID STREP A ([facility] CARE ONLY)  POCT PREGNANCY, URINE   Imaging Review No results found.  EKG Interpretation    Date/Time:    Ventricular Rate:    PR Interval:    QRS Duration:   QT Interval:    QTC Calculation:   R Axis:     Text Interpretation:              MDM  I advised patient that I would not have anyway of providing her with an opinion regarding  whether or not something in her home was causing her chronic sore throat and wheezing. My only suggestion was for her to discuss her concerns with her landlord or to chose to move to a new home. UPT negative UA: concentrated urine with hematuria and  small LE but nitrite negative. Will send for C&S. Will need to provide any necessary treatment based upon C&S results as she reports allergy to vast majority of families of medications used to treat simple lower UTIs.   Jess Barters [place], [place] 08/31/13 [phone]

## 2013-09-01 NOTE — ED Provider Notes (Signed)
Medical screening examination/treatment/procedure(s) were performed by resident physician or non-physician practitioner and as supervising physician I was immediately available for consultation/collaboration.   Momin Misko DOUGLAS MD.   Shayda Kalka D Wasil Wolke, MD 09/01/13 1438 

## 2013-09-02 ENCOUNTER — Encounter (HOSPITAL_COMMUNITY): Payer: Self-pay | Admitting: Emergency Medicine

## 2013-09-02 ENCOUNTER — Emergency Department (HOSPITAL_COMMUNITY)
Admission: EM | Admit: 2013-09-02 | Discharge: 2013-09-03 | Disposition: A | Discharge status: Home / Self Care | Payer: PRIVATE HEALTH INSURANCE | Attending: Emergency Medicine | Admitting: Emergency Medicine

## 2013-09-02 ENCOUNTER — Telehealth (HOSPITAL_COMMUNITY): Payer: Self-pay | Admitting: *Deleted

## 2013-09-02 DIAGNOSIS — G8929 Other chronic pain: Secondary | ICD-10-CM | POA: Insufficient documentation

## 2013-09-02 DIAGNOSIS — Z9851 Tubal ligation status: Secondary | ICD-10-CM | POA: Insufficient documentation

## 2013-09-02 DIAGNOSIS — N390 Urinary tract infection, site not specified: Secondary | ICD-10-CM | POA: Insufficient documentation

## 2013-09-02 DIAGNOSIS — Z8701 Personal history of pneumonia (recurrent): Secondary | ICD-10-CM | POA: Insufficient documentation

## 2013-09-02 DIAGNOSIS — Z79899 Other long term (current) drug therapy: Secondary | ICD-10-CM | POA: Insufficient documentation

## 2013-09-02 DIAGNOSIS — F411 Generalized anxiety disorder: Secondary | ICD-10-CM | POA: Insufficient documentation

## 2013-09-02 DIAGNOSIS — E559 Vitamin D deficiency, unspecified: Secondary | ICD-10-CM | POA: Insufficient documentation

## 2013-09-02 DIAGNOSIS — Z880 Allergy status to penicillin: Secondary | ICD-10-CM | POA: Insufficient documentation

## 2013-09-02 LAB — CBC WITH DIFFERENTIAL/PLATELET
Basophils Absolute: 0 10*3/uL (ref 0.0–0.1)
Basophils Relative: 0 % (ref 0–1)
EOS ABS: 0.3 10*3/uL (ref 0.0–0.7)
EOS PCT: 5 % (ref 0–5)
HCT: 33.8 % — ABNORMAL LOW (ref 36.0–46.0)
HEMOGLOBIN: 11.5 g/dL — AB (ref 12.0–15.0)
LYMPHS ABS: 2.5 10*3/uL (ref 0.7–4.0)
Lymphocytes Relative: 46 % (ref 12–46)
MCH: 26.6 pg (ref 26.0–34.0)
MCHC: 34 g/dL (ref 30.0–36.0)
MCV: 78.2 fL (ref 78.0–100.0)
MONOS PCT: 9 % (ref 3–12)
Monocytes Absolute: 0.5 10*3/uL (ref 0.1–1.0)
Neutro Abs: 2.2 10*3/uL (ref 1.7–7.7)
Neutrophils Relative %: 40 % — ABNORMAL LOW (ref 43–77)
Platelets: 277 10*3/uL (ref 150–400)
RBC: 4.32 MIL/uL (ref 3.87–5.11)
RDW: 15.2 % (ref 11.5–15.5)
WBC: 5.6 10*3/uL (ref 4.0–10.5)

## 2013-09-02 LAB — POCT I-STAT, CHEM 8
BUN: 11 mg/dL (ref 6–23)
CREATININE: 0.7 mg/dL (ref 0.50–1.10)
Calcium, Ion: 1.2 mmol/L (ref 1.12–1.23)
Chloride: 104 mEq/L (ref 96–112)
GLUCOSE: 85 mg/dL (ref 70–99)
HCT: 38 % (ref 36.0–46.0)
HEMOGLOBIN: 12.9 g/dL (ref 12.0–15.0)
POTASSIUM: 3.5 meq/L — AB (ref 3.7–5.3)
Sodium: 140 mEq/L (ref 137–147)
TCO2: 26 mmol/L (ref 0–100)

## 2013-09-02 LAB — CULTURE, GROUP A STREP

## 2013-09-02 LAB — URINE CULTURE
Colony Count: >=100000
SPECIAL REQUESTS: NORMAL

## 2013-09-02 MED ORDER — KETOROLAC TROMETHAMINE 30 MG/ML IJ SOLN
30.0000 mg | Freq: Once | INTRAMUSCULAR | Status: AC
  Administered 2013-09-02: 30 mg via INTRAVENOUS
  Filled 2013-09-02: qty 1

## 2013-09-02 MED ORDER — FOSFOMYCIN TROMETHAMINE 3 G PO PACK
3.0000 g | PACK | Freq: Once | ORAL | Status: AC
  Administered 2013-09-02: 3 g via ORAL
  Filled 2013-09-02: qty 3

## 2013-09-02 MED ORDER — SODIUM CHLORIDE 0.9 % IV SOLN
Freq: Once | INTRAVENOUS | Status: AC
  Administered 2013-09-02: 23:00:00 via INTRAVENOUS

## 2013-09-02 NOTE — ED Provider Notes (Signed)
CSN: [id number]     Arrival date & time 09/02/13  2219 History   First MD Initiated Contact with Patient 09/02/13 2254     Chief Complaint  Patient presents with  . Urinary Tract Infection   (Consider location/radiation/quality/duration/timing/severity/associated sxs/prior Treatment) HPI Comments: [facility] was called back with +culture results sent to the ED because of multiple antibiotic allergies   The history is provided by the patient.    Past Medical History  Diagnosis Date  . Vitamin D deficiency   . Anxiety   . Anginal pain     "not until today" (01/15/2013)  . Pneumonia 06/2012  . Daily headache   . Chronic lower back pain   . PTSD (post-traumatic stress disorder)   . Frequent UTI   . Peripheral neuropathy    Past Surgical History  Procedure Laterality Date  . Back surgery    . Knee arthroscopy Left 1980's  . Shoulder arthroscopy w/ rotator cuff repair Left 1990's  . Tubal ligation  2008  . Replacement disc anterior lumbar spine  2009   No family history on file. History  Substance Use Topics  . Smoking status: Never Smoker   . Smokeless tobacco: Never Used  . Alcohol Use: Yes     Comment: occasional   OB History   Grav Para Term Preterm Abortions TAB SAB Ect Mult Living                 Review of Systems  Constitutional: Negative for fever.  Respiratory: Negative for shortness of breath.   Gastrointestinal: Negative for nausea, vomiting and abdominal pain.  Genitourinary: Positive for dysuria and frequency. Negative for vaginal discharge.  Musculoskeletal: Positive for back pain.  Skin: Negative for rash.  All other systems reviewed and are negative.    Allergies  Bee venom; Imitrex; Other; Flagyl; Azithromycin; Clindamycin/lincomycin; Keflex; Macrodantin; Penicillins; and Sulfa antibiotics  Home Medications   Current Outpatient Rx  Name  Route  Sig  Dispense  Refill  . albuterol (PROVENTIL HFA;VENTOLIN HFA) 108 (90  BASE) MCG/ACT inhaler   Inhalation   Inhale 2 puffs into the lungs every 6 (six) hours as needed for wheezing.         . clonazePAM (KLONOPIN) 1 MG tablet   Oral   Take 1 mg by mouth 2 (two) times daily as needed for anxiety.         Marland Kitchen. zolpidem (AMBIEN) 10 MG tablet   Oral   Take 10 mg by mouth at bedtime as needed for sleep.          . naproxen (NAPROSYN) 375 MG tablet   Oral   Take 1 tablet (375 mg total) by mouth 2 (two) times daily with a meal.   30 tablet   0    BP 137/99  Pulse 86  Temp(Src) 97.5 F (36.4 C) (Oral)  Resp 18  Ht 5' 5.5" (1.664 m)  Wt 192 lb (87.091 kg)  BMI 31.45 kg/m2  SpO2 100% Physical Exam  Nursing note and vitals reviewed. Constitutional: She is oriented to person, place, and time. She appears well-nourished. No distress.  HENT:  Head: Normocephalic.  Right Ear: External ear normal.  Left Ear: External ear normal.  Mouth/Throat: Oropharynx is clear and moist.  Eyes: Pupils are equal, round, and reactive to light.  Neck: Normal range of motion.  Cardiovascular: Normal rate and regular rhythm.   Pulmonary/Chest: Effort normal.  Abdominal: Soft. She exhibits no  distension.  Musculoskeletal: Normal range of motion. She exhibits no edema and no tenderness.  Neurological: She is alert and oriented to person, place, and time.  Skin: Skin is warm. No rash noted.    ED Course  Procedures (including critical care time) Labs Review Labs Reviewed  CBC WITH DIFFERENTIAL - Abnormal; Notable for the following:    Hemoglobin 11.5 (*)    HCT 33.8 (*)    Neutrophils Relative % 40 (*)    All other components within normal limits  POCT I-STAT, CHEM 8 - Abnormal; Notable for the following:    Potassium 3.5 (*)    All other components within normal limits   Imaging Review No results found.  EKG Interpretation   None       MDM   1. UTI (lower urinary tract infection)     Labs reviewed normal white count and renal function  Given  monural due to multiple drug allergies    Arman Filter, NP 09/03/13 0107  Arman Filter, NP 09/03/13 0107  Arman Filter, NP 09/03/13 0522

## 2013-09-02 NOTE — ED Notes (Signed)
Pt getting urine specimen at this time.

## 2013-09-02 NOTE — ED Notes (Addendum)
Urine culture: >100, 000 colonies E. Coli.  Message sent to [facility] .  She said the medications she needs, she is allergic to them. Call pt. and check on reaction to meds listed as allergies and see if she can take any of them.  I called and left a message to call.  Call 1. Vassie MoselleYork, Zackari Ruane M 09/02/2013 Pt. called back.  Pt. verified x 2 and given results.  Pt. told that the drugs that are sensitive, she is allergic or have to be given IV.  Pt. said she not only gets hives with Macrobid and Keflex but gets swelling in her throat. She said the last time she had strep throat she had to have IV antibiotics with observation and something to counteract her allergy. Discussed with PA, she said her options are going to the [facility], getting a PCP here in town that could treat her with IV antibiotics as an outpatient, or going to the ED.  She should do that if she develops fever, worsening symptoms and back pain.  If she gets a PCP we can fax her result so they don't have to repeat the culture. Pt. told this. She said she called the [facility] and they gave her an appt. for 3/5. I told her to call  them back and explain that she needs to be seen now for IV antibiotics for a UTI.  Pt. Ssaid she is having back pain today and has taken 6 Aleve.  I repeated the option of tx. In the ED.  She asked if they will be able to see her labs and I said yes. 09/02/2013

## 2013-09-02 NOTE — ED Notes (Signed)
Pt. received a call advising her to go to ER , results of urine C/S reveals E. Coli infection , reports low back pain . Denies fever or chills.

## 2013-09-02 NOTE — ED Notes (Signed)
Throat culture: neg.,  Urine culture: >100,000 colonies E. Coli.  [facility] Vassie MoselleYork, Alanis Clift M 09/02/2013

## 2013-09-03 MED ORDER — NAPROXEN 375 MG PO TABS: 375.0000 mg | ORAL_TABLET | Freq: Two times a day (BID) | ORAL | Status: DC

## 2013-09-03 NOTE — ED Provider Notes (Signed)
Medical screening examination/treatment/procedure(s) were performed by non-physician practitioner and as supervising physician I was immediately available for consultation/collaboration.    Olivia Mackielga M Makiah Foye, MD 09/03/13 [phone]

## 2013-09-03 NOTE — Discharge Instructions (Signed)
You were given at 1 time dose of antibiotics.  That will last in your system for the next 21 days.  It treated, Escherichia coli infections in the urine.  Extremely well.  He will not need any more antibiotics.  U. been given a prescription for Naprosyn.  For pain control.  Please try to drink plenty of fluids.  Make an appointment with your primary care physician for followup

## 2013-09-04 ENCOUNTER — Ambulatory Visit: Payer: 59 | Admitting: Physician Assistant

## 2013-09-08 ENCOUNTER — Ambulatory Visit: Payer: 59 | Admitting: Family Medicine

## 2013-11-28 ENCOUNTER — Emergency Department (INDEPENDENT_AMBULATORY_CARE_PROVIDER_SITE_OTHER)
Admission: EM | Admit: 2013-11-28 | Discharge: 2013-11-28 | Disposition: A | Discharge status: Home / Self Care | Payer: PRIVATE HEALTH INSURANCE | Source: Home / Self Care | Attending: Emergency Medicine | Admitting: Emergency Medicine

## 2013-11-28 ENCOUNTER — Emergency Department (INDEPENDENT_AMBULATORY_CARE_PROVIDER_SITE_OTHER): Payer: PRIVATE HEALTH INSURANCE

## 2013-11-28 ENCOUNTER — Encounter (HOSPITAL_COMMUNITY): Payer: Self-pay | Admitting: Emergency Medicine

## 2013-11-28 DIAGNOSIS — Y92009 Unspecified place in unspecified non-institutional (private) residence as the place of occurrence of the external cause: Secondary | ICD-10-CM

## 2013-11-28 DIAGNOSIS — T148XXA Other injury of unspecified body region, initial encounter: Secondary | ICD-10-CM

## 2013-11-28 DIAGNOSIS — Z23 Encounter for immunization: Secondary | ICD-10-CM

## 2013-11-28 DIAGNOSIS — W540XXA Bitten by dog, initial encounter: Secondary | ICD-10-CM

## 2013-11-28 MED ORDER — LEVOFLOXACIN 500 MG PO TABS: 500.0000 mg | ORAL_TABLET | Freq: Every day | ORAL | Status: DC

## 2013-11-28 MED ORDER — TETANUS-DIPHTH-ACELL PERTUSSIS 5-2.5-18.5 LF-MCG/0.5 IM SUSP
INTRAMUSCULAR | Status: AC
  Filled 2013-11-28: qty 0.5

## 2013-11-28 MED ORDER — MUPIROCIN 2 % EX OINT: 1.0000 "application " | TOPICAL_OINTMENT | Freq: Three times a day (TID) | CUTANEOUS | Status: DC

## 2013-11-28 MED ORDER — HYDROCODONE-ACETAMINOPHEN 5-325 MG PO TABS
ORAL_TABLET | ORAL | Status: DC
Start: 2013-11-28 — End: 2014-01-31

## 2013-11-28 MED ORDER — TETANUS-DIPHTH-ACELL PERTUSSIS 5-2.5-18.5 LF-MCG/0.5 IM SUSP
0.5000 mL | Freq: Once | INTRAMUSCULAR | Status: AC
  Administered 2013-11-28: 0.5 mL via INTRAMUSCULAR

## 2013-11-28 NOTE — Discharge Instructions (Signed)

## 2013-11-28 NOTE — ED Notes (Signed)
C/o L wrist pain, HA, neck pain, dog attacked yesterday.  She states the dog belongs to her boyfriend. She states the boyfriend state the dog is up to date on its vaccines. She is not up to date on her tdap.

## 2013-11-28 NOTE — ED Provider Notes (Signed)
Chief Complaint   No chief complaint on file.   History of Present Illness   Edrick OhFelisa Alvina FilbertCoates is a 38 year old female who was bitten by her boyfriend's bulldog yesterday around 5:30 PM. She was dog sitting the dog while her boyfriend is out of town. Apparently the dog is very possessive. She went to feed and water him the dog, the dog got mad at her, and bit her on the left wrist and hand. The skin was broken minimally. There was not much bleeding. Ever since then she's had pain, swelling, bruising of the wrist and hand, and it hurts to move the wrist and the fingers. She denies any numbness or tingling. I called the boyfriend on the phone and he states that he has documentation that the dog's rabies immunizations are up-to-date. The dog has not been acting strangely. The patient states her last tetanus vaccine was in 2003, so she needs one today. She denies any fever or chills.  Review of Systems   Other than as noted above, the patient denies any of the following symptoms: Systemic:  No fevers or chills. Musculoskeletal:  No joint pain or arthritis.  Neurological:  No muscular weakness or paresthesias.  PMFSH   Past medical history, family history, social history, meds, and allergies were reviewed.   She is allergic to almost all antibiotics. She has a history of vitamin D deficiency, anxiety, chest pain, pneumonia, daily headache, chronic lower back pain, PTSD, frequent urinary tract infections, and peripheral neuropathy.  Physical Examination   Vital signs:  BP 128/92  Pulse 77  Temp(Src) 98.3 F (36.8 C) (Oral)  SpO2 99%  LMP 08/22/2013 Gen:  Alert and oriented times 3.  In no distress. Musculoskeletal:  Exam of the hand reveals there is a tiny, shallow abrasion on the dorsum of the wrist. This is very tender to touch. There is minimal surrounding erythema and no purulent drainage. She has diffuse tenderness to palpation of the wrist and hand. There is no bruising, no other puncture  wounds, and only minimal swelling. She has a limited range of motion of wrist, MCPs, and PIPs but not the DIPs with pain.  Otherwise, all joints had a full a ROM with no swelling, bruising or deformity.  No edema, pulses full. Extremities were warm and pink.  Capillary refill was brisk.  Skin:  Clear, warm and dry.  No rash. Neuro:  Alert and oriented times 3.  Muscle strength was normal.  Sensation was intact to light touch.   Radiology   Dg Hand Complete Left  11/28/2013   CLINICAL DATA:  Dog bite yesterday, puncture at left wrist, pain, swelling  EXAM: LEFT HAND - COMPLETE 3+ VIEW  COMPARISON:  None  FINDINGS: Osseous mineralization normal.  Joint spaces preserved.  No fracture, dislocation, or bone destruction.  No soft tissue gas or radiopaque foreign body.  IMPRESSION: No acute abnormalities.   Electronically Signed   By: Ulyses SouthwardMark  Boles M.D.   On: 11/28/2013 20:55   I reviewed the images independently and personally and concur with the radiologist's findings.  [facility]   Given a Tdap vaccine.  Assessment   The primary encounter diagnosis was Dog bite. A diagnosis of Place of occurrence, home was also pertinent to this visit.  She only has a very shallow puncture wound, and it appears to be only minimally infected. She's to watch out for worsening infection. Unfortunately she is allergic to almost all antibiotics. She states only one she can  take his Levaquin, so I gave her a prescription for this. She's also to apply mupirocin ointment topically 3 times a day, and she was given hydrocodone for the pain.  Plan  1.  Meds:  The following meds were prescribed:   Discharge Medication List as of 11/28/2013  9:07 PM    START taking these medications   Details  HYDROcodone-acetaminophen (NORCO/VICODIN) 5-325 MG per tablet 1 to 2 tabs every 4 to 6 hours as needed for pain., Print    levofloxacin (LEVAQUIN) 500 MG tablet Take 1 tablet (500 mg total) by mouth daily., Starting  11/28/2013, Until Discontinued, Normal    mupirocin ointment (BACTROBAN) 2 % Apply 1 application topically 3 (three) times daily., Starting 11/28/2013, Until Discontinued, Normal        2.  Patient Education/Counseling:  The patient was given appropriate handouts, self care instructions, and instructed in symptomatic relief, including rest and activity, and elevation.   3.  Follow up:  The patient was told to follow up here if no better in 3 to 4 days, or sooner if becoming worse in any way, and given some red flag symptoms such as worsening pain, fever, swelling, or neurological symptoms which would prompt immediate return.        Reuben Likesavid C Spurgeon Gancarz, MD 11/28/13 2217

## 2013-12-24 ENCOUNTER — Telehealth (HOSPITAL_COMMUNITY): Payer: Self-pay | Admitting: Family Medicine

## 2013-12-25 NOTE — ED Notes (Signed)
Pt is trying to fill norco rx from 4/24.  I called the patient. She used her existing supply of norco for her pain from the visit from April and has run out.  I will write a new rx for 6 days (until she can get her chronic medication filled).  She will pick it up  Rodolph BongEvan S Deliana Avalos, MD 12/25/13 [phone]

## 2013-12-30 ENCOUNTER — Ambulatory Visit (INDEPENDENT_AMBULATORY_CARE_PROVIDER_SITE_OTHER): Payer: PRIVATE HEALTH INSURANCE | Admitting: Internal Medicine

## 2013-12-30 VITALS — BP 122/74 | HR 100 | Temp 98.0°F | Resp 17 | Ht 65.0 in | Wt 200.0 lb

## 2013-12-30 DIAGNOSIS — N760 Acute vaginitis: Secondary | ICD-10-CM

## 2013-12-30 DIAGNOSIS — N898 Other specified noninflammatory disorders of vagina: Secondary | ICD-10-CM

## 2013-12-30 DIAGNOSIS — R3915 Urgency of urination: Secondary | ICD-10-CM

## 2013-12-30 DIAGNOSIS — L293 Anogenital pruritus, unspecified: Secondary | ICD-10-CM

## 2013-12-30 LAB — POCT URINALYSIS DIPSTICK
Bilirubin, UA: NEGATIVE
Blood, UA: NEGATIVE
Glucose, UA: NEGATIVE
NITRITE UA: NEGATIVE
PH UA: 6
Spec Grav, UA: >=1.03
UROBILINOGEN UA: 0.2

## 2013-12-30 LAB — POCT WET PREP WITH KOH
CLUE CELLS WET PREP PER HPF POC: NEGATIVE
KOH Prep POC: NEGATIVE
Trichomonas, UA: NEGATIVE
YEAST WET PREP PER HPF POC: NEGATIVE

## 2013-12-30 LAB — POCT UA - MICROSCOPIC ONLY
CASTS, UR, LPF, POC: NEGATIVE
CRYSTALS, UR, HPF, POC: NEGATIVE
Mucus, UA: POSITIVE
YEAST UA: NEGATIVE

## 2013-12-30 MED ORDER — FLUCONAZOLE 150 MG PO TABS: 150.0000 mg | ORAL_TABLET | Freq: Once | ORAL | Status: DC

## 2013-12-30 NOTE — Progress Notes (Signed)
   Subjective:    Patient ID: Monique Acosta, female    DOB: 1976/01/05, 38 y.o.   MRN: [id number]  HPI  38 year old female [facility] with:  Vaginal itch and discomfort x 3 days, no vaginal itch Patient tried monostat 1 day and presently on 2nd day of monostat 3 days Less itching on the outside  [facility] discharge x today, possibly from medication Burning when urinates, and urgency, no urinary frequency  slight nausea, no vomiting, no abdominal pain Diarrhea x 6 days Fatigue, no fever  No chance of pregnancy, tubal ligation.   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 She appears well-developed and well-nourished. No distress.  HENT:  Head: Normocephalic.  Eyes: EOM are normal. Pupils are equal, round, and reactive to light.  Cardiovascular: Normal rate, regular rhythm and normal heart sounds.   Pulmonary/Chest: Effort normal.  Abdominal: Soft. Bowel sounds are normal. There is no tenderness.  Neurological: She is alert and oriented to person, place, and time. She exhibits normal muscle tone. Coordination normal.  Psychiatric: She has a normal mood and affect. Her behavior is normal.   Results for orders placed in visit on 12/30/13  POCT WET PREP WITH KOH      Result Value Ref Range   Trichomonas, UA Negative     Clue Cells Wet Prep HPF POC neg     Epithelial Wet Prep HPF POC 2-5     Yeast Wet Prep HPF POC neg     Bacteria Wet Prep HPF POC trace     RBC Wet Prep HPF POC 0-1     WBC Wet Prep HPF POC 1-3     KOH Prep POC Negative    POCT UA - MICROSCOPIC ONLY      Result Value Ref Range   WBC, Ur, HPF, POC 1-4     RBC, urine, microscopic 1-2     Bacteria, U Microscopic trace     Mucus, UA pos     Epithelial cells, urine per micros 1-4     Crystals, Ur, HPF, POC neg     Casts, Ur, LPF, POC neg     Yeast, UA neg    POCT  URINALYSIS DIPSTICK      Result Value Ref Range   Color, UA yellow     Clarity, UA slightly cloudy     Glucose, UA neg     Bilirubin, UA neg     Ketones, UA trace     Spec Grav, UA >=1.030     Blood, UA neg     pH, UA 6.0     Protein, UA trace     Urobilinogen, UA 0.2     Nitrite, UA neg     Leukocytes, UA moderate (2+)      No clear sxs of uti/agrees to wait for culture.      Assessment & Plan:  Pyuria/Will CS urine Vaginal itching Trial diflucan

## 2013-12-30 NOTE — Patient Instructions (Signed)
Vaginitis Vaginitis is an inflammation of the vagina. It is most often caused by a change in the normal balance of the bacteria and yeast that live in the vagina. This change in balance causes an overgrowth of certain bacteria or yeast, which causes the inflammation. There are different types of vaginitis, but the most common types are:  Bacterial vaginosis.  Yeast infection (candidiasis).  Trichomoniasis vaginitis. This is a sexually transmitted infection (STI).  Viral vaginitis.  Atropic vaginitis.  Allergic vaginitis. CAUSES  The cause depends on the type of vaginitis. Vaginitis can be caused by:  Bacteria (bacterial vaginosis).  Yeast (yeast infection).  A parasite (trichomoniasis vaginitis)  A virus (viral vaginitis).  Low hormone levels (atrophic vaginitis). Low hormone levels can occur during pregnancy, breastfeeding, or after menopause.  Irritants, such as bubble baths, scented tampons, and feminine sprays (allergic vaginitis). Other factors can change the normal balance of the yeast and bacteria that live in the vagina. These include:  Antibiotic medicines.  Poor hygiene.  Diaphragms, vaginal sponges, spermicides, birth control pills, and intrauterine devices (IUD).  Sexual intercourse.  Infection.  Uncontrolled diabetes.  A weakened immune system. SYMPTOMS  Symptoms can vary depending on the cause of the vaginitis. Common symptoms include:  Abnormal vaginal discharge.  The discharge is white, gray, or yellow with bacterial vaginosis.  The discharge is thick, white, and cheesy with a yeast infection.  The discharge is frothy and yellow or greenish with trichomoniasis.  A bad vaginal odor.  The odor is fishy with bacterial vaginosis.  Vaginal itching, pain, or swelling.  Painful intercourse.  Pain or burning when urinating. Sometimes, there are no symptoms. TREATMENT  Treatment will vary depending on the type of infection.   Bacterial  vaginosis and trichomoniasis are often treated with antibiotic creams or pills.  Yeast infections are often treated with antifungal medicines, such as vaginal creams or suppositories.  Viral vaginitis has no cure, but symptoms can be treated with medicines that relieve discomfort. Your sexual partner should be treated as well.  Atrophic vaginitis may be treated with an estrogen cream, pill, suppository, or vaginal ring. If vaginal dryness occurs, lubricants and moisturizing creams may help. You may be told to avoid scented soaps, sprays, or douches.  Allergic vaginitis treatment involves quitting the use of the product that is causing the problem. Vaginal creams can be used to treat the symptoms. HOME CARE INSTRUCTIONS   Take all medicines as directed by your caregiver.  Keep your genital area clean and dry. Avoid soap and only rinse the area with water.  Avoid douching. It can remove the healthy bacteria in the vagina.  Do not use tampons or have sexual intercourse until your vaginitis has been treated. Use sanitary pads while you have vaginitis.  Wipe from front to back. This avoids the spread of bacteria from the rectum to the vagina.  Let air reach your genital area.  Wear cotton underwear to decrease moisture buildup.  Avoid wearing underwear while you sleep until your vaginitis is gone.  Avoid tight pants and underwear or nylons without a cotton panel.  Take off wet clothing (especially bathing suits) as soon as possible.  Use mild, non-scented products. Avoid using irritants, such as:  Scented feminine sprays.  Fabric softeners.  Scented detergents.  Scented tampons.  Scented soaps or bubble baths.  Practice safe sex and use condoms. Condoms may prevent the spread of trichomoniasis and viral vaginitis. SEEK MEDICAL CARE IF:   You have abdominal pain.  You   have a fever or persistent symptoms for more than 2 3 days.  You have a fever and your symptoms suddenly  get worse. Document Released: 05/21/2007 Document Revised: 04/17/2012 Document Reviewed: 01/04/2012 Exit[employer] Patient Information 2014 [employer].  

## 2014-01-01 LAB — URINE CULTURE
COLONY COUNT: NO GROWTH
Organism ID, Bacteria: NO GROWTH

## 2014-01-02 ENCOUNTER — Telehealth: Payer: Self-pay

## 2014-01-02 NOTE — Telephone Encounter (Signed)
LM for pt to RTC/ call back.

## 2014-01-02 NOTE — Telephone Encounter (Signed)
Pt was in here 2 days ago, for a yeast infection, she said her symptoms are getting worse and she is convinced that it is not a yeast infection, still waiting to receive culture that was done on last visit. Please advice pt [phone]

## 2014-01-19 ENCOUNTER — Ambulatory Visit (INDEPENDENT_AMBULATORY_CARE_PROVIDER_SITE_OTHER): Payer: PRIVATE HEALTH INSURANCE | Admitting: Family Medicine

## 2014-01-19 VITALS — BP 122/72 | HR 102 | Temp 97.8°F | Resp 16 | Ht 65.5 in | Wt 199.4 lb

## 2014-01-19 DIAGNOSIS — N898 Other specified noninflammatory disorders of vagina: Secondary | ICD-10-CM

## 2014-01-19 LAB — POCT WET PREP WITH KOH
Clue Cells Wet Prep HPF POC: 100
KOH Prep POC: NEGATIVE
Trichomonas, UA: POSITIVE
Yeast Wet Prep HPF POC: NEGATIVE

## 2014-01-19 NOTE — Patient Instructions (Signed)
I will be in touch with you tomorrow to make the rest of our plan.

## 2014-01-19 NOTE — Progress Notes (Signed)
[facility] [address], [place] [id number] [phone]  Date:  01/19/2014   Name:  Monique Acosta Amerman   DOB:  01/07/1976   MRN:  [id number]  PCP:  Leo GrosserPICKARD,WARREN TOM, MD    Chief Complaint: Vaginal Discharge   History of Present Illness:  Monique Acosta Bickford is a 38 y.o. very pleasant female patient who presents with the following:  Here today with a recurrent vaginal complaint.   She was here on 5/26 with vaginal discomfort, she was using OTC monistat.  Her wet prep and UA were benign at that time. She was treated with diflucan, urine culture was negative.    She has taken 3 doses of diflucan each a week apart, and also an OTC monistat 7 day reatment.   She notes that she continues to have irritation and itching, and she seems to have a vaginal odor. Her sx got better temporarily after most recent tx but then returned She also notes a "yellow white discharge" and she is using a panytyliner for protection.  .   She tried a douche a couple of weeks ago.  She is not sure about urinary sx- sometimes she will feel some dysuria but not consistently.  She has not noted dark color of her urine- she dos not think that she has a UTI at this time.    LMP was a bout 2 weeks ago.   She has a history of anxiety/ PTSD and some back pain history.   She is married but has not been SA with her husband since her sx began.  She has no reason to suspect his infidelity and has not been SA with anyone else herself  Patient Active Problem List   Diagnosis Date Noted  . Atypical chest pain 01/15/2013  . Anxiety state, unspecified 01/15/2013  . Depression 01/15/2013  . PTSD (post-traumatic stress disorder) 01/15/2013    Past Medical History  Diagnosis Date  . Vitamin D deficiency   . Anxiety   . Anginal pain     "not until today" (01/15/2013)  . Pneumonia 06/2012  . Daily headache   . Chronic lower back pain   . PTSD (post-traumatic stress disorder)   . Frequent UTI   .  Peripheral neuropathy     Past Surgical History  Procedure Laterality Date  . Back surgery    . Knee arthroscopy Left 1980's  . Shoulder arthroscopy w/ rotator cuff repair Left 1990's  . Tubal ligation  2008  . Replacement disc anterior lumbar spine  2009    History  Substance Use Topics  . Smoking status: Never Smoker   . Smokeless tobacco: Never Used  . Alcohol Use: Yes     Comment: occasional    No family history on file.  Allergies  Allergen Reactions  . Bee Venom Anaphylaxis  . Imitrex [Sumatriptan] Anaphylaxis  . Other Hives, Nausea And Vomiting and Rash    All antibiotics  . Flagyl [Metronidazole] Hives and Nausea And Vomiting  . Azithromycin Hives, Nausea And Vomiting and Rash  . Clindamycin/Lincomycin Hives, Nausea And Vomiting and Rash  . Keflex [Cephalexin] Hives, Nausea And Vomiting and Rash  . Macrodantin [Nitrofurantoin Macrocrystal] Hives, Nausea And Vomiting and Rash  . Penicillins Hives, Nausea And Vomiting and Rash  . Sulfa Antibiotics Hives, Nausea And Vomiting and Rash    Medication list has been reviewed and updated.  Current Outpatient Prescriptions on File Prior to Visit  Medication Sig Dispense Refill  . albuterol (  PROVENTIL HFA;VENTOLIN HFA) 108 (90 BASE) MCG/ACT inhaler Inhale 2 puffs into the lungs every 6 (six) hours as needed for wheezing.      . clonazePAM (KLONOPIN) 1 MG tablet Take 1 mg by mouth 2 (two) times daily as needed for anxiety.      . fluconazole (DIFLUCAN) 150 MG tablet Take 1 tablet (150 mg total) by mouth once.  3 tablet  1  . HYDROcodone-acetaminophen (NORCO/VICODIN) 5-325 MG per tablet 1 to 2 tabs every 4 to 6 hours as needed for pain.  20 tablet  0  . levofloxacin (LEVAQUIN) 500 MG tablet Take 1 tablet (500 mg total) by mouth daily.  7 tablet  0  . mupirocin ointment (BACTROBAN) 2 % Apply 1 application topically 3 (three) times daily.  22 g  0  . naproxen (NAPROSYN) 375 MG tablet Take 1 tablet (375 mg total) by mouth 2  (two) times daily with a meal.  30 tablet  0  . zolpidem (AMBIEN) 10 MG tablet Take 10 mg by mouth at bedtime as needed for sleep.        No current facility-administered medications on file prior to visit.    Review of Systems:  As per HPI- otherwise negative. Confirmed that she has hives with both flagyl and clindamycin   Physical Examination: Filed Vitals:   01/19/14 1431  BP: 122/72  Pulse: 102  Temp: 97.8 F (36.6 C)  Resp: 16   Filed Vitals:   01/19/14 1431  Height: 5' 5.5" (1.664 m)  Weight: 199 lb 6.4 oz (90.447 kg)   Body mass index is 32.67 kg/(m^2). Ideal Body Weight: Weight in (lb) to have BMI = 25: 152.2  GEN: WDWN, NAD, Non-toxic, A & O x 3, obese, looks well HEENT: Atraumatic, Normocephalic. Neck supple. No masses, No LAD. Ears and Nose: No external deformity. CV: RRR, No M/G/R. No JVD. No thrill. No extra heart sounds. PULM: CTA B, no wheezes, crackles, rhonchi. No retractions. No resp. distress. No accessory muscle use. ABD: S, NT, ND. No rebound. No HSM. EXTR: No c/c/e NEURO Normal gait.  PSYCH: Normally interactive. Conversant. Not depressed or anxious appearing.  Calm demeanor.  GU: thin, fishy discharge, suspect trich.  Otherwise exam is normal, no masses, no CMT, no adnexal masses or tenderness   Results for orders placed in visit on 01/19/14  POCT WET PREP WITH KOH      Result Value Ref Range   Trichomonas, UA Positive     Clue Cells Wet Prep HPF POC 100%     Epithelial Wet Prep HPF POC 8-15     Yeast Wet Prep HPF POC neg     Bacteria Wet Prep HPF POC 3+     RBC Wet Prep HPF POC 0-1     WBC Wet Prep HPF POC 15-tntc     KOH Prep POC Negative     Personally confirmed the presence of trichomonas on her wet prep. Also sent off a urine RNA  Assessment and Plan: Vaginal discharge - Plan: POCT Wet Prep with KOH, GC/Chlamydia Probe Amp, Trichomonas vaginalis, RNA  Diagnosed with trichomonas vaginalis today.  She is quite upset as she now  suspects that her husband is unfaithful.  Also, she has allergies that preclude simple outpatient PO treatment of this infection.  Will get together with ID to make a treatment plan for her which will likely involve desensitization to flagyl  Signed Abbe AmsterdamJessica Copland, MD

## 2014-01-20 ENCOUNTER — Ambulatory Visit (INDEPENDENT_AMBULATORY_CARE_PROVIDER_SITE_OTHER): Payer: PRIVATE HEALTH INSURANCE | Admitting: Internal Medicine

## 2014-01-20 ENCOUNTER — Encounter: Payer: Self-pay | Admitting: Internal Medicine

## 2014-01-20 VITALS — BP 145/97 | HR 86 | Temp 98.1°F | Ht 65.0 in | Wt 200.0 lb

## 2014-01-20 DIAGNOSIS — A5901 Trichomonal vulvovaginitis: Secondary | ICD-10-CM | POA: Insufficient documentation

## 2014-01-20 LAB — GC/CHLAMYDIA PROBE AMP
CT Probe RNA: NEGATIVE
GC Probe RNA: NEGATIVE

## 2014-01-20 LAB — TRICHOMONAS VAGINALIS, PROBE AMP: T vaginalis RNA: POSITIVE — AB

## 2014-01-20 NOTE — Progress Notes (Signed)
   Subjective:    Patient ID: Monique HoyerFelisa Acosta, female    DOB: 11/23/1975, 38 y.o.   MRN: [id number]  HPI Here for a new patient visit.  Has had several weeks of symptoms of vaginitis.  Took diflucan for about 3 weeks with no relief.  Saw her PCP and diagnosed with trichomonas.  Tearful today as it seems her partner was unfaithful and brought it home.  Also tested negative for GC and chlamydia.  No other symptoms including no rash, no ulcers.  No history of herpes.    Allergy to metronidazole is hives, n/v.  Also multiple other meds and has similar reaction.  Happened about 15 years ago.     Review of Systems  Constitutional: Negative for fever.  HENT: Negative for sore throat.   Gastrointestinal: Negative for diarrhea.  Genitourinary: Negative for urgency and genital sores.  Skin: Negative for rash.       Objective:   Physical Exam  Constitutional: She appears well-developed and well-nourished. She appears distressed.  tearful  HENT:  Mouth/Throat: No oropharyngeal exudate.  Eyes: No scleral icterus.  Skin: No rash noted.          Assessment & Plan:

## 2014-01-20 NOTE — Assessment & Plan Note (Signed)
Will have her sent to immunology for desensitization to tinidazole for a one 2 gram dose once.    Will also check RPR and HIV to complete work up.  She will be called with any positive results, otherwise can follow up PRN.

## 2014-01-20 NOTE — Progress Notes (Signed)
Rob, Can you see if patient can come and be added to rob's clinic this afternoon. Evaluate to tx trichomonas in patient with metronidazole allergy

## 2014-01-21 LAB — HIV ANTIBODY (ROUTINE TESTING W REFLEX): HIV: NONREACTIVE

## 2014-01-21 LAB — RPR

## 2014-01-22 ENCOUNTER — Telehealth: Payer: Self-pay | Admitting: *Deleted

## 2014-01-22 NOTE — Telephone Encounter (Signed)
Pt notified.   Andree CossHowell, Michelle M, RN

## 2014-01-22 NOTE — Telephone Encounter (Signed)
Message copied by Andree CossHOWELL, MICHELLE M on Thu Jan 22, 2014  5:19 PM ------      Message from: Gardiner BarefootOMER, ROBERT W      Created: Thu Jan 22, 2014  5:09 PM       Please let the patient know her HIV and RPR was negative.  thanks ------

## 2014-01-31 ENCOUNTER — Emergency Department (HOSPITAL_COMMUNITY)
Admission: EM | Admit: 2014-01-31 | Discharge: 2014-01-31 | Disposition: A | Discharge status: Home / Self Care | Payer: PRIVATE HEALTH INSURANCE | Attending: Emergency Medicine | Admitting: Emergency Medicine

## 2014-01-31 ENCOUNTER — Encounter (HOSPITAL_COMMUNITY): Payer: Self-pay | Admitting: Emergency Medicine

## 2014-01-31 ENCOUNTER — Emergency Department (HOSPITAL_COMMUNITY): Payer: PRIVATE HEALTH INSURANCE

## 2014-01-31 DIAGNOSIS — Y929 Unspecified place or not applicable: Secondary | ICD-10-CM | POA: Insufficient documentation

## 2014-01-31 DIAGNOSIS — Y9389 Activity, other specified: Secondary | ICD-10-CM | POA: Insufficient documentation

## 2014-01-31 DIAGNOSIS — Z880 Allergy status to penicillin: Secondary | ICD-10-CM | POA: Insufficient documentation

## 2014-01-31 DIAGNOSIS — S43109A Unspecified dislocation of unspecified acromioclavicular joint, initial encounter: Secondary | ICD-10-CM | POA: Insufficient documentation

## 2014-01-31 DIAGNOSIS — S43005A Unspecified dislocation of left shoulder joint, initial encounter: Secondary | ICD-10-CM

## 2014-01-31 DIAGNOSIS — F411 Generalized anxiety disorder: Secondary | ICD-10-CM | POA: Insufficient documentation

## 2014-01-31 DIAGNOSIS — Z9889 Other specified postprocedural states: Secondary | ICD-10-CM | POA: Insufficient documentation

## 2014-01-31 DIAGNOSIS — Z8669 Personal history of other diseases of the nervous system and sense organs: Secondary | ICD-10-CM | POA: Insufficient documentation

## 2014-01-31 DIAGNOSIS — Z87440 Personal history of urinary (tract) infections: Secondary | ICD-10-CM | POA: Insufficient documentation

## 2014-01-31 DIAGNOSIS — Z8679 Personal history of other diseases of the circulatory system: Secondary | ICD-10-CM | POA: Insufficient documentation

## 2014-01-31 DIAGNOSIS — Z79899 Other long term (current) drug therapy: Secondary | ICD-10-CM | POA: Insufficient documentation

## 2014-01-31 DIAGNOSIS — X500XXA Overexertion from strenuous movement or load, initial encounter: Secondary | ICD-10-CM | POA: Insufficient documentation

## 2014-01-31 DIAGNOSIS — E559 Vitamin D deficiency, unspecified: Secondary | ICD-10-CM | POA: Insufficient documentation

## 2014-01-31 MED ORDER — OXYCODONE-ACETAMINOPHEN 5-325 MG PO TABS: 2.0000 | ORAL_TABLET | Freq: Once | ORAL | Status: DC

## 2014-01-31 MED ORDER — OXYCODONE-ACETAMINOPHEN 5-325 MG PO TABS: 1.0000 | ORAL_TABLET | ORAL | Status: AC | PRN

## 2014-01-31 NOTE — ED Notes (Signed)
Pt here from home with c/o left shoulder dislocation after reaching down to get something , pt has history of same , pt received 100 fentanyl per EMS

## 2014-01-31 NOTE — ED Notes (Signed)
Patient transported to X-ray 

## 2014-01-31 NOTE — ED Provider Notes (Signed)
CSN: [id number]     Arrival date & time 01/31/14  1046 History   First MD Initiated Contact with Patient 01/31/14 1046     Chief Complaint  Patient presents with  . Shoulder Injury     (Consider location/radiation/quality/duration/timing/severity/associated sxs/prior Treatment) HPI 38 year old female who states she was decorating for her grand child's birthday when she reached down with her left hand and extended her arm causing her shoulder to dislocate. She has had 2 shoulder dislocations in the past. [facility] and told that she should have surgery but has not followed up with them. She has not fallen does not have any other injuries. She denies numbness, tingling, or weakness in the arm or hand distal to the injury. She is right-hand dominant.  Past Medical History  Diagnosis Date  . Vitamin D deficiency   . Anxiety   . Anginal pain     "not until today" (01/15/2013)  . Pneumonia 06/2012  . Daily headache   . Chronic lower back pain   . PTSD (post-traumatic stress disorder)   . Frequent UTI   . Peripheral neuropathy    Past Surgical History  Procedure Laterality Date  . Back surgery    . Knee arthroscopy Left 1980's  . Shoulder arthroscopy w/ rotator cuff repair Left 1990's  . Tubal ligation  2008  . Replacement disc anterior lumbar spine  2009   History reviewed. No pertinent family history. History  Substance Use Topics  . Smoking status: Never Smoker   . Smokeless tobacco: Never Used  . Alcohol Use: Yes     Comment: occasional   OB History   Grav Para Term Preterm Abortions TAB SAB Ect Mult Living                 Review of Systems  All other systems reviewed and are negative.     Allergies  Bee venom; Imitrex; Other; Flagyl; Azithromycin; Clindamycin/lincomycin; Keflex; Macrodantin; Penicillins; and Sulfa antibiotics  Home Medications   Prior to Admission medications   Medication Sig Start Date End Date  Taking? Authorizing Provider  albuterol (PROVENTIL HFA;VENTOLIN HFA) 108 (90 BASE) MCG/ACT inhaler Inhale 2 puffs into the lungs every 6 (six) hours as needed for wheezing.   Yes Historical Provider, MD  azelastine (ASTELIN) 0.1 % nasal spray Place 1 spray into both nostrils 2 (two) times daily. Use in each nostril as directed   Yes Historical Provider, MD  azelastine (OPTIVAR) 0.05 % ophthalmic solution Place 1 drop into both eyes 2 (two) times daily.   Yes Historical Provider, MD  budesonide-formoterol (SYMBICORT) 80-4.5 MCG/ACT inhaler Inhale 2 puffs into the lungs 2 (two) times daily as needed (for shortness of breath).   Yes Historical Provider, MD  Cholecalciferol (VITAMIN D) 2000 UNITS CAPS Take 2,000 Units by mouth daily.   Yes Historical Provider, MD  citalopram (CELEXA) 10 MG tablet Take 10 mg by mouth daily.   Yes Historical Provider, MD  clonazePAM (KLONOPIN) 0.5 MG tablet Take 0.5 mg by mouth 2 (two) times daily as needed for anxiety.   Yes Historical Provider, MD  ferrous sulfate 325 (65 FE) MG tablet Take 325 mg by mouth daily with breakfast.   Yes Historical Provider, MD  levocetirizine (XYZAL) 5 MG tablet Take 5 mg by mouth every evening.   Yes Historical Provider, MD  montelukast (SINGULAIR) 10 MG tablet Take 10 mg by mouth at bedtime.   Yes Historical Provider, MD  zolpidem (AMBIEN) 10 MG  tablet Take 10 mg by mouth at bedtime as needed for sleep.    Yes Historical Provider, MD   BP 111/74  Pulse 72  Temp(Src) 97.9 F (36.6 C) (Oral)  Resp 18  SpO2 100%  LMP 01/06/2014 Physical Exam  Nursing note and vitals reviewed. Constitutional: She is oriented to person, place, and time. She appears well-developed and well-nourished. She appears distressed.  HENT:  Head: Normocephalic and atraumatic.  Eyes: Pupils are equal, round, and reactive to light.  Neck: Normal range of motion.  Cardiovascular: Normal rate and regular rhythm.   Pulmonary/Chest: Effort normal and breath sounds  normal.  Abdominal: Soft.  Musculoskeletal:  Left shoulder with deformity consistent with anterior dislocation. Head of humerus is palpable anterior to shoulder. Radial pulses are 2+ and intact. Sensation is intact to arm and fingers.  Neurological: She is alert and oriented to person, place, and time.  Skin: Skin is warm and dry.  Psychiatric: She has a normal mood and affect.    ED Course  Reduction of dislocation Date/Time: 01/31/2014 12:36 PM Performed by: Hilario QuarryAY, DANIELLE S Authorized by: Hilario QuarryAY, DANIELLE S Consent: Verbal consent obtained. Risks and benefits: risks, benefits and alternatives were discussed Consent given by: patient Patient identity confirmed: verbally with patient Time out: Immediately prior to procedure a "time out" was called to verify the correct patient, procedure, equipment, support staff and site/side marked as required. Local anesthesia used: no Patient sedated: no Patient tolerance: Patient tolerated the procedure well with no immediate complications. Comments: Shoulder reduced with external rotation and distraction of joint.  Shoulder joint palpable reduced.  Neurovascularly intact.    (including critical care time) Labs Review Labs Reviewed - No data to display  Imaging Review Dg Shoulder Left  01/31/2014   CLINICAL DATA:  Postreduction  EXAM: LEFT SHOULDER - 2+ VIEW  COMPARISON:  05/03/2013  FINDINGS: Two views of the right shoulder submitted. There is normal apparent alignment of glenohumeral joint. There is high riding humeral head. This may be due to chronic rotator cuff insufficiency. Mild degenerative changes AC joint. No acute fracture is noted.  IMPRESSION: There is normal apparent alignment of glenohumeral joint. There is high riding humeral head. This may be due to chronic rotator cuff insufficiency. Mild degenerative changes AC joint. No acute fracture is noted.   Electronically Signed   By: Natasha MeadLiviu  Pop M.D.   On: 01/31/2014 11:52     EKG  Interpretation None      MDM  38 y.o. Female with left shoulder dislocation succesfully reduced here in ed.  Shoulder immobilizer placed and patient advised to have close follow up.  Final diagnoses:  Shoulder dislocation, left, initial encounter      Hilario Quarryanielle S Ray, MD 01/31/14 1654

## 2014-01-31 NOTE — Discharge Instructions (Signed)
Please followup with your orthopedic doctor this week.  Shoulder Dislocation  Shoulder dislocation is when your upper arm bone (humerus) is forced out of your shoulder joint. Your doctor will put your shoulder back into the joint by pulling on your arm or through surgery. Your arm will be placed in a shoulder immobilizer or sling. The shoulder immobilizer or sling holds your shoulder in place while it heals. HOME CARE   Rest your injured joint. Do not move it until instructed to do so.  Put ice on your injured joint as told by your doctor.  Put ice in a plastic bag.  Place a towel between your skin and the bag.  Leave the ice on for 15-20 minutes at a time, every 2 hours while you are awake.  Only take medicines as told by your doctor.  Squeeze a ball to exercise your hand. GET HELP RIGHT AWAY IF:   Your splint or sling becomes damaged.  Your pain becomes worse, not better.  You lose feeling in your arm or hand.  Your arm or hand becomes white or cold. MAKE SURE YOU:   Understand these instructions.  Will watch your condition.  Will get help right away if you are not doing well or get worse. Document Released: 10/16/2011 Document Reviewed: 10/16/2011 [facility] Patient Information 2015 [place], [place]. This information is not intended to replace advice given to you by your health care provider. Make sure you discuss any questions you have with your health care provider.

## 2014-03-10 ENCOUNTER — Ambulatory Visit: Payer: PRIVATE HEALTH INSURANCE | Admitting: Family Medicine
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
## 2014-06-15 ENCOUNTER — Emergency Department (INDEPENDENT_AMBULATORY_CARE_PROVIDER_SITE_OTHER)
Admission: EM | Admit: 2014-06-15 | Discharge: 2014-06-15 | Disposition: A | Discharge status: Home / Self Care | Payer: PRIVATE HEALTH INSURANCE | Source: Home / Self Care | Attending: Family Medicine | Admitting: Family Medicine

## 2014-06-15 ENCOUNTER — Encounter (HOSPITAL_COMMUNITY): Payer: Self-pay | Admitting: Emergency Medicine

## 2014-06-15 DIAGNOSIS — J069 Acute upper respiratory infection, unspecified: Secondary | ICD-10-CM

## 2014-06-15 DIAGNOSIS — R1110 Vomiting, unspecified: Secondary | ICD-10-CM

## 2014-06-15 DIAGNOSIS — R197 Diarrhea, unspecified: Secondary | ICD-10-CM

## 2014-06-15 MED ORDER — ONDANSETRON HCL 4 MG/2ML IJ SOLN
INTRAMUSCULAR | Status: AC
  Filled 2014-06-15: qty 2

## 2014-06-15 MED ORDER — ONDANSETRON HCL 8 MG PO TABS: 8.0000 mg | ORAL_TABLET | Freq: Three times a day (TID) | ORAL | Status: DC | PRN

## 2014-06-15 MED ORDER — SODIUM CHLORIDE 0.9 % IV BOLUS (SEPSIS)
1000.0000 mL | Freq: Once | INTRAVENOUS | Status: AC
  Administered 2014-06-15: 1000 mL via INTRAVENOUS

## 2014-06-15 MED ORDER — ONDANSETRON HCL 4 MG/2ML IJ SOLN
4.0000 mg | Freq: Once | INTRAMUSCULAR | Status: AC
  Administered 2014-06-15: 4 mg via INTRAMUSCULAR

## 2014-06-15 MED ORDER — SODIUM CHLORIDE 0.9 % IJ SOLN
INTRAMUSCULAR | Status: AC
  Filled 2014-06-15: qty 6

## 2014-06-15 MED ORDER — ACETAMINOPHEN 325 MG PO TABS
975.0000 mg | ORAL_TABLET | Freq: Once | ORAL | Status: AC
  Administered 2014-06-15: 975 mg via ORAL

## 2014-06-15 MED ORDER — ACETAMINOPHEN 325 MG PO TABS
ORAL_TABLET | ORAL | Status: AC
  Filled 2014-06-15: qty 3

## 2014-06-15 NOTE — ED Notes (Signed)
Pt states that she has had episodes of vomiting, diarrhea, fever, fatigue, and cough since Saturday night 06/13/2014. Pt is in no acute distress at this time.

## 2014-06-15 NOTE — ED Provider Notes (Signed)
Monique Acosta is a 38 y.o. female who presents to Urgent Care today for Fever or body aches chills sweats of vomiting and diarrhea. Symptoms present for a day and a half. She is having trouble keeping fluids down. She feels fatigued. No chest pains or palpitations. She has tried ibuprofen and Robitussin which have helped a bit. She feels well otherwise. No significant abdominal pain.   Past Medical History  Diagnosis Date  . Vitamin D deficiency   . Anxiety   . Anginal pain     "not until today" (01/15/2013)  . Pneumonia 06/2012  . Daily headache   . Chronic lower back pain   . PTSD (post-traumatic stress disorder)   . Frequent UTI   . Peripheral neuropathy    Past Surgical History  Procedure Laterality Date  . Back surgery    . Knee arthroscopy Left 1980's  . Shoulder arthroscopy w/ rotator cuff repair Left 1990's  . Tubal ligation  2008  . Replacement disc anterior lumbar spine  2009   History  Substance Use Topics  . Smoking status: Never Smoker   . Smokeless tobacco: Never Used  . Alcohol Use: Yes     Comment: occasional   ROS as above Medications: No current facility-administered medications for this encounter.   Current Outpatient Prescriptions  Medication Sig Dispense Refill  . albuterol (PROVENTIL HFA;VENTOLIN HFA) 108 (90 BASE) MCG/ACT inhaler Inhale 2 puffs into the lungs every 6 (six) hours as needed for wheezing.    Marland Kitchen. azelastine (ASTELIN) 0.1 % nasal spray Place 1 spray into both nostrils 2 (two) times daily. Use in each nostril as directed    . azelastine (OPTIVAR) 0.05 % ophthalmic solution Place 1 drop into both eyes 2 (two) times daily.    . budesonide-formoterol (SYMBICORT) 80-4.5 MCG/ACT inhaler Inhale 2 puffs into the lungs 2 (two) times daily as needed (for shortness of breath).    . Cholecalciferol (VITAMIN D) 2000 UNITS CAPS Take 2,000 Units by mouth daily.    . citalopram (CELEXA) 10 MG tablet Take 10 mg by mouth daily.    . clonazePAM (KLONOPIN) 0.5  MG tablet Take 0.5 mg by mouth 2 (two) times daily as needed for anxiety.    . ferrous sulfate 325 (65 FE) MG tablet Take 325 mg by mouth daily with breakfast.    . levocetirizine (XYZAL) 5 MG tablet Take 5 mg by mouth every evening.    . montelukast (SINGULAIR) 10 MG tablet Take 10 mg by mouth at bedtime.    . ondansetron (ZOFRAN) 8 MG tablet Take 1 tablet (8 mg total) by mouth every 8 (eight) hours as needed for nausea or vomiting. 20 tablet 0  . oxyCODONE-acetaminophen (PERCOCET/ROXICET) 5-325 MG per tablet Take 1 tablet by mouth every 4 (four) hours as needed for severe pain. 15 tablet 0  . zolpidem (AMBIEN) 10 MG tablet Take 10 mg by mouth at bedtime as needed for sleep.      Allergies  Allergen Reactions  . Bee Venom Anaphylaxis  . Imitrex [Sumatriptan] Anaphylaxis  . Other Hives, Nausea And Vomiting and Rash    All antibiotics  . Flagyl [Metronidazole] Hives and Nausea And Vomiting  . Azithromycin Hives, Nausea And Vomiting and Rash  . Clindamycin/Lincomycin Hives, Nausea And Vomiting and Rash  . Keflex [Cephalexin] Hives, Nausea And Vomiting and Rash  . Macrodantin [Nitrofurantoin Macrocrystal] Hives, Nausea And Vomiting and Rash  . Penicillins Hives, Nausea And Vomiting and Rash  . Sulfa Antibiotics Hives, Nausea And  Vomiting and Rash     Exam:  Temp(Src) 99.5 F (37.5 C) (Oral)  Resp 26  SpO2 98%  LMP 04/22/2014  Orthostatic VS for the past 24 hrs:  BP- Lying Pulse- Lying BP- Sitting Pulse- Sitting BP- Standing at 0 minutes Pulse- Standing at 0 minutes  06/15/14 0844 120/84 mmHg 106 125/82 mmHg 113 117/78 mmHg 123      Gen: Well NAD HEENT: EOMI,  MMM Lungs: Normal work of breathing. CTABL Heart: RRR no MRG Abd: NABS, Soft. Nondistended, Nontender no rebound or guarding Exts: Brisk capillary refill, warm and well perfused.   Patient was given a 1 L normal saline bolus, 975 mg of oral Tylenol, and 4 mg of IV Zofran, and felt a little better. Her maximum heart rate  standing was 100 bpm.  No results found for this or any previous visit (from the past 24 hour(s)). No results found.  Assessment and Plan: 38 y.o. female with viral illness with respiratory components and vomiting and diarrhea. She appears to be clinically stable and well for discharge. Her heart rate decreased after 1 L of normal saline. She was discharged with a prescription for Zofran and instructions use Tylenol as needed. A school note was provided.  Discussed warning signs or symptoms. Please see discharge instructions. Patient expresses understanding.     Rodolph BongEvan S Joshual Terrio, MD 06/15/14 1044

## 2014-06-15 NOTE — Discharge Instructions (Signed)
Thank you for coming in today. Take Tylenol or ibuprofen for pain fevers or chills. Use Zofran every 8 hours as needed for vomiting. Call or go to the emergency room if you get worse, have trouble breathing, have chest pains, or palpitations.   Viral Gastroenteritis Viral gastroenteritis is also known as stomach flu. This condition affects the stomach and intestinal tract. It can cause sudden diarrhea and vomiting. The illness typically lasts 3 to 8 days. Most people develop an immune response that eventually gets rid of the virus. While this natural response develops, the virus can make you quite ill. CAUSES  Many different viruses can cause gastroenteritis, such as rotavirus or noroviruses. You can catch one of these viruses by consuming contaminated food or water. You may also catch a virus by sharing utensils or other personal items with an infected person or by touching a contaminated surface. SYMPTOMS  The most common symptoms are diarrhea and vomiting. These problems can cause a severe loss of body fluids (dehydration) and a body salt (electrolyte) imbalance. Other symptoms may include:  Fever.  Headache.  Fatigue.  Abdominal pain. DIAGNOSIS  Your caregiver can usually diagnose viral gastroenteritis based on your symptoms and a physical exam. A stool sample may also be taken to test for the presence of viruses or other infections. TREATMENT  This illness typically goes away on its own. Treatments are aimed at rehydration. The most serious cases of viral gastroenteritis involve vomiting so severely that you are not able to keep fluids down. In these cases, fluids must be given through an intravenous line (IV). HOME CARE INSTRUCTIONS   Drink enough fluids to keep your urine clear or pale yellow. Drink small amounts of fluids frequently and increase the amounts as tolerated.  Ask your caregiver for specific rehydration instructions.  Avoid:  Foods high in  sugar.  Alcohol.  Carbonated drinks.  Tobacco.  Juice.  Caffeine drinks.  Extremely hot or cold fluids.  Fatty, greasy foods.  Too much intake of anything at one time.  Dairy products until 24 to 48 hours after diarrhea stops.  You may consume probiotics. Probiotics are active cultures of beneficial bacteria. They may lessen the amount and number of diarrheal stools in adults. Probiotics can be found in yogurt with active cultures and in supplements.  Wash your hands well to avoid spreading the virus.  Only take over-the-counter or prescription medicines for pain, discomfort, or fever as directed by your caregiver. Do not give aspirin to children. Antidiarrheal medicines are not recommended.  Ask your caregiver if you should continue to take your regular prescribed and over-the-counter medicines.  Keep all follow-up appointments as directed by your caregiver. SEEK IMMEDIATE MEDICAL CARE IF:   You are unable to keep fluids down.  You do not urinate at least once every 6 to 8 hours.  You develop shortness of breath.  You notice blood in your stool or vomit. This may look like coffee grounds.  You have abdominal pain that increases or is concentrated in one small area (localized).  You have persistent vomiting or diarrhea.  You have a fever.  The patient is a child younger than 3 months, and he or she has a fever.  The patient is a child older than 3 months, and he or she has a fever and persistent symptoms.  The patient is a child older than 3 months, and he or she has a fever and symptoms suddenly get worse.  The patient is a baby, and  he or she has no tears when crying. MAKE SURE YOU:   Understand these instructions.  Will watch your condition.  Will get help right away if you are not doing well or get worse. Document Released: 07/24/2005 Document Revised: 10/16/2011 Document Reviewed: 05/10/2011 [facility] Patient Information 2015 [place], [place]. This  information is not intended to replace advice given to you by your health care provider. Make sure you discuss any questions you have with your health care provider.   Upper Respiratory Infection, Adult An upper respiratory infection (URI) is also sometimes known as the common cold. The upper respiratory tract includes the nose, sinuses, throat, trachea, and bronchi. Bronchi are the airways leading to the lungs. Most people improve within 1 week, but symptoms can last up to 2 weeks. A residual cough may last even longer.  CAUSES Many different viruses can infect the tissues lining the upper respiratory tract. The tissues become irritated and inflamed and often become very moist. Mucus production is also common. A cold is contagious. You can easily spread the virus to others by oral contact. This includes kissing, sharing a glass, coughing, or sneezing. Touching your mouth or nose and then touching a surface, which is then touched by another person, can also spread the virus. SYMPTOMS  Symptoms typically develop 1 to 3 days after you come in contact with a cold virus. Symptoms vary from person to person. They may include:  Runny nose.  Sneezing.  Nasal congestion.  Sinus irritation.  Sore throat.  Loss of voice (laryngitis).  Cough.  Fatigue.  Muscle aches.  Loss of appetite.  Headache.  Low-grade fever. DIAGNOSIS  You might diagnose your own cold based on familiar symptoms, since most people get a cold 2 to 3 times a year. Your caregiver can confirm this based on your exam. Most importantly, your caregiver can check that your symptoms are not due to another disease such as strep throat, sinusitis, pneumonia, asthma, or epiglottitis. Blood tests, throat tests, and X-rays are not necessary to diagnose a common cold, but they may sometimes be helpful in excluding other more serious diseases. Your caregiver will decide if any further tests are required. RISKS AND COMPLICATIONS  You  may be at risk for a more severe case of the common cold if you smoke cigarettes, have chronic heart disease (such as heart failure) or lung disease (such as asthma), or if you have a weakened immune system. The very young and very old are also at risk for more serious infections. Bacterial sinusitis, middle ear infections, and bacterial pneumonia can complicate the common cold. The common cold can worsen asthma and chronic obstructive pulmonary disease (COPD). Sometimes, these complications can require emergency medical care and may be life-threatening. PREVENTION  The best way to protect against getting a cold is to practice good hygiene. Avoid oral or hand contact with people with cold symptoms. Wash your hands often if contact occurs. There is no clear evidence that vitamin C, vitamin E, echinacea, or exercise reduces the chance of developing a cold. However, it is always recommended to get plenty of rest and practice good nutrition. TREATMENT  Treatment is directed at relieving symptoms. There is no cure. Antibiotics are not effective, because the infection is caused by a virus, not by bacteria. Treatment may include:  Increased fluid intake. Sports drinks offer valuable electrolytes, sugars, and fluids.  Breathing heated mist or steam (vaporizer or shower).  Eating chicken soup or other clear broths, and maintaining good nutrition.  Getting  plenty of rest.  Using gargles or lozenges for comfort.  Controlling fevers with ibuprofen or acetaminophen as directed by your caregiver.  Increasing usage of your inhaler if you have asthma. Zinc gel and zinc lozenges, taken in the first 24 hours of the common cold, can shorten the duration and lessen the severity of symptoms. Pain medicines may help with fever, muscle aches, and throat pain. A variety of non-prescription medicines are available to treat congestion and runny nose. Your caregiver can make recommendations and may suggest nasal or lung  inhalers for other symptoms.  HOME CARE INSTRUCTIONS   Only take over-the-counter or prescription medicines for pain, discomfort, or fever as directed by your caregiver.  Use a warm mist humidifier or inhale steam from a shower to increase air moisture. This may keep secretions moist and make it easier to breathe.  Drink enough water and fluids to keep your urine clear or pale yellow.  Rest as needed.  Return to work when your temperature has returned to normal or as your caregiver advises. You may need to stay home longer to avoid infecting others. You can also use a face mask and careful hand washing to prevent spread of the virus. SEEK MEDICAL CARE IF:   After the first few days, you feel you are getting worse rather than better.  You need your caregiver's advice about medicines to control symptoms.  You develop chills, worsening shortness of breath, or brown or red sputum. These may be signs of pneumonia.  You develop yellow or brown nasal discharge or pain in the face, especially when you bend forward. These may be signs of sinusitis.  You develop a fever, swollen neck glands, pain with swallowing, or white areas in the back of your throat. These may be signs of strep throat. SEEK IMMEDIATE MEDICAL CARE IF:   You have a fever.  You develop severe or persistent headache, ear pain, sinus pain, or chest pain.  You develop wheezing, a prolonged cough, cough up blood, or have a change in your usual mucus (if you have chronic lung disease).  You develop sore muscles or a stiff neck. Document Released: 01/17/2001 Document Revised: 10/16/2011 Document Reviewed: 10/29/2013 [facility] Patient Information 2015 [place]ExitCare, [place]LL[place]. This information is not intended to replace advice given to you by your health care provider. Make sure you discuss any questions you have with your health care provider.

## 2014-06-16 LAB — POCT I-STAT, CHEM 8
BUN: 9 mg/dL (ref 6–23)
CHLORIDE: 101 meq/L (ref 96–112)
CREATININE: 0.6 mg/dL (ref 0.50–1.10)
Calcium, Ion: 1.19 mmol/L (ref 1.12–1.23)
Glucose, Bld: 99 mg/dL (ref 70–99)
HCT: 39 % (ref 36.0–46.0)
Hemoglobin: 13.3 g/dL (ref 12.0–15.0)
Potassium: 3.2 mEq/L — ABNORMAL LOW (ref 3.7–5.3)
Sodium: 137 mEq/L (ref 137–147)
TCO2: 26 mmol/L (ref 0–100)

## 2014-06-27 ENCOUNTER — Encounter (HOSPITAL_COMMUNITY): Payer: Self-pay | Admitting: Emergency Medicine

## 2014-06-27 ENCOUNTER — Emergency Department (HOSPITAL_COMMUNITY)
Admission: EM | Admit: 2014-06-27 | Discharge: 2014-06-27 | Disposition: A | Discharge status: Home / Self Care | Payer: PRIVATE HEALTH INSURANCE | Attending: Emergency Medicine | Admitting: Emergency Medicine

## 2014-06-27 DIAGNOSIS — R0602 Shortness of breath: Secondary | ICD-10-CM | POA: Diagnosis present

## 2014-06-27 DIAGNOSIS — Z79899 Other long term (current) drug therapy: Secondary | ICD-10-CM | POA: Diagnosis not present

## 2014-06-27 DIAGNOSIS — X58XXXA Exposure to other specified factors, initial encounter: Secondary | ICD-10-CM | POA: Diagnosis not present

## 2014-06-27 DIAGNOSIS — Y9289 Other specified places as the place of occurrence of the external cause: Secondary | ICD-10-CM | POA: Diagnosis not present

## 2014-06-27 DIAGNOSIS — E559 Vitamin D deficiency, unspecified: Secondary | ICD-10-CM | POA: Insufficient documentation

## 2014-06-27 DIAGNOSIS — Z8701 Personal history of pneumonia (recurrent): Secondary | ICD-10-CM | POA: Insufficient documentation

## 2014-06-27 DIAGNOSIS — Y9389 Activity, other specified: Secondary | ICD-10-CM | POA: Insufficient documentation

## 2014-06-27 DIAGNOSIS — Z87440 Personal history of urinary (tract) infections: Secondary | ICD-10-CM | POA: Diagnosis not present

## 2014-06-27 DIAGNOSIS — G8929 Other chronic pain: Secondary | ICD-10-CM | POA: Insufficient documentation

## 2014-06-27 DIAGNOSIS — F419 Anxiety disorder, unspecified: Secondary | ICD-10-CM | POA: Diagnosis not present

## 2014-06-27 DIAGNOSIS — R05 Cough: Secondary | ICD-10-CM | POA: Insufficient documentation

## 2014-06-27 DIAGNOSIS — Y998 Other external cause status: Secondary | ICD-10-CM | POA: Diagnosis not present

## 2014-06-27 DIAGNOSIS — Z8669 Personal history of other diseases of the nervous system and sense organs: Secondary | ICD-10-CM | POA: Insufficient documentation

## 2014-06-27 DIAGNOSIS — T7840XA Allergy, unspecified, initial encounter: Secondary | ICD-10-CM | POA: Diagnosis not present

## 2014-06-27 LAB — CBC WITH DIFFERENTIAL/PLATELET
Basophils Absolute: 0 10*3/uL (ref 0.0–0.1)
Basophils Relative: 0 % (ref 0–1)
EOS ABS: 0.1 10*3/uL (ref 0.0–0.7)
EOS PCT: 2 % (ref 0–5)
HEMATOCRIT: 34.5 % — AB (ref 36.0–46.0)
Hemoglobin: 11.3 g/dL — ABNORMAL LOW (ref 12.0–15.0)
LYMPHS PCT: 44 % (ref 12–46)
Lymphs Abs: 2.5 10*3/uL (ref 0.7–4.0)
MCH: 26 pg (ref 26.0–34.0)
MCHC: 32.8 g/dL (ref 30.0–36.0)
MCV: 79.3 fL (ref 78.0–100.0)
Monocytes Absolute: 0.6 10*3/uL (ref 0.1–1.0)
Monocytes Relative: 11 % (ref 3–12)
Neutro Abs: 2.4 10*3/uL (ref 1.7–7.7)
Neutrophils Relative %: 43 % (ref 43–77)
Platelets: 328 10*3/uL (ref 150–400)
RBC: 4.35 MIL/uL (ref 3.87–5.11)
RDW: 14.5 % (ref 11.5–15.5)
WBC: 5.6 10*3/uL (ref 4.0–10.5)

## 2014-06-27 LAB — BASIC METABOLIC PANEL
Anion gap: 17 — ABNORMAL HIGH (ref 5–15)
BUN: 14 mg/dL (ref 6–23)
CALCIUM: 9.4 mg/dL (ref 8.4–10.5)
CO2: 24 meq/L (ref 19–32)
CREATININE: 0.58 mg/dL (ref 0.50–1.10)
Chloride: 99 mEq/L (ref 96–112)
GFR calc Af Amer: >90 mL/min (ref >90)
GLUCOSE: 100 mg/dL — AB (ref 70–99)
Potassium: 3.5 mEq/L — ABNORMAL LOW (ref 3.7–5.3)
Sodium: 140 mEq/L (ref 137–147)

## 2014-06-27 MED ORDER — FAMOTIDINE IN NACL 20-0.9 MG/50ML-% IV SOLN
20.0000 mg | Freq: Once | INTRAVENOUS | Status: AC
  Administered 2014-06-27: 20 mg via INTRAVENOUS
  Filled 2014-06-27: qty 50

## 2014-06-27 MED ORDER — PREDNISONE 20 MG PO TABS: ORAL_TABLET | ORAL | Status: AC

## 2014-06-27 MED ORDER — METHYLPREDNISOLONE SODIUM SUCC 125 MG IJ SOLR
125.0000 mg | Freq: Once | INTRAMUSCULAR | Status: AC
  Administered 2014-06-27: 125 mg via INTRAVENOUS
  Filled 2014-06-27: qty 2

## 2014-06-27 MED ORDER — DIPHENHYDRAMINE HCL 50 MG/ML IJ SOLN
25.0000 mg | Freq: Once | INTRAMUSCULAR | Status: AC
  Administered 2014-06-27: 25 mg via INTRAVENOUS
  Filled 2014-06-27: qty 1

## 2014-06-27 MED ORDER — ALBUTEROL SULFATE (2.5 MG/3ML) 0.083% IN NEBU
5.0000 mg | INHALATION_SOLUTION | Freq: Once | RESPIRATORY_TRACT | Status: AC
  Administered 2014-06-27: 5 mg via RESPIRATORY_TRACT
  Filled 2014-06-27: qty 6

## 2014-06-27 MED ORDER — SODIUM CHLORIDE 0.9 % IV BOLUS (SEPSIS)
1000.0000 mL | Freq: Once | INTRAVENOUS | Status: AC
  Administered 2014-06-27: 1000 mL via INTRAVENOUS

## 2014-06-27 NOTE — ED Notes (Signed)
Pt discharged home with all belongings, pt alert, oriented and ambulatory upon discharge. 1 new RX prescribed, pt verbalizes understanding of discharge instructions, pt driven home by daughter, pt escorted to exit via wheel chair

## 2014-06-27 NOTE — ED Provider Notes (Signed)
CSN: [id number]     Arrival date & time 06/27/14  0028 History   First MD Initiated Contact with Patient 06/27/14 0039     This chart was scribed for Monique Canalavid H Yao, MD by Arlan OrganAshley Acosta, ED Scribe. This patient was seen in room D35C/D35C and the patient's care was started 12:47 AM.   Chief Complaint  Patient presents with  . Shortness of Breath   The history is provided by the patient. No language interpreter was used.    HPI Comments: Monique Acosta is a 38 y.o. female with a PMHx of PTSD who presents to the Emergency Department complaining of constant, moderate SOB with associated mild chest tightness onset 30 minutes while visiting a friend upstairs. She states that the room may be cleaned with a cleaning solution that she might be allergic to. Pt also reports constant coughing resulting in gagging that has now resolved. She denies any potential for choking. No new perfumes or scents. Pt is prescribed an EpiPen but did not have pen with her today. Monique Acosta reports a previous episode several months ago when she was here for an MRI. At that time, MRI was not performed due to ongoing coughing and sneezing. She attributes symptoms to cleaning solution at that time. She denies any congestion, fever, or chills. Pt has multiple antibiotics as listed below.  Past Medical History  Diagnosis Date  . Vitamin D deficiency   . Anxiety   . Anginal pain     "not until today" (01/15/2013)  . Pneumonia 06/2012  . Daily headache   . Chronic lower back pain   . PTSD (post-traumatic stress disorder)   . Frequent UTI   . Peripheral neuropathy    Past Surgical History  Procedure Laterality Date  . Back surgery    . Knee arthroscopy Left 1980's  . Shoulder arthroscopy w/ rotator cuff repair Left 1990's  . Tubal ligation  2008  . Replacement disc anterior lumbar spine  2009   No family history on file. History  Substance Use Topics  . Smoking status: Never Smoker   . Smokeless tobacco: Never Used  .  Alcohol Use: Yes     Comment: occasional   OB History    No data available     Review of Systems  HENT: Negative for congestion.   Respiratory: Positive for cough and shortness of breath.   Cardiovascular: Negative for chest pain.  All other systems reviewed and are negative.     Allergies  Bee venom; Imitrex; Other; Flagyl; Azithromycin; Clindamycin/lincomycin; Keflex; Macrodantin; Penicillins; and Sulfa antibiotics  Home Medications   Prior to Admission medications   Medication Sig Start Date End Date Taking? Authorizing Provider  albuterol (PROVENTIL HFA;VENTOLIN HFA) 108 (90 BASE) MCG/ACT inhaler Inhale 2 puffs into the lungs every 6 (six) hours as needed for wheezing.   Yes Historical Provider, MD  budesonide-formoterol (SYMBICORT) 80-4.5 MCG/ACT inhaler Inhale 2 puffs into the lungs 2 (two) times daily as needed (for shortness of breath).   Yes Historical Provider, MD  EPINEPHrine 0.3 mg/0.3 mL IJ SOAJ injection Inject 0.3 mg into the muscle as needed (for allergies).   Yes Historical Provider, MD  loratadine (CLARITIN) 10 MG tablet Take 10 mg by mouth daily.   Yes Historical Provider, MD  ondansetron (ZOFRAN) 8 MG tablet Take 1 tablet (8 mg total) by mouth every 8 (eight) hours as needed for nausea or vomiting. 06/15/14  Yes Rodolph BongEvan S Corey, MD  zolpidem (AMBIEN) 10 MG tablet Take  10 mg by mouth at bedtime as needed for sleep.    Yes Historical Provider, MD  azelastine (ASTELIN) 0.1 % nasal spray Place 1 spray into both nostrils 2 (two) times daily. Use in each nostril as directed    Historical Provider, MD  azelastine (OPTIVAR) 0.05 % ophthalmic solution Place 1 drop into both eyes 2 (two) times daily.    Historical Provider, MD  Cholecalciferol (VITAMIN D) 2000 UNITS CAPS Take 2,000 Units by mouth daily.    Historical Provider, MD  citalopram (CELEXA) 10 MG tablet Take 10 mg by mouth daily.    Historical Provider, MD  clonazePAM (KLONOPIN) 0.5 MG tablet Take 0.5 mg by mouth 2  (two) times daily as needed for anxiety.    Historical Provider, MD  ferrous sulfate 325 (65 FE) MG tablet Take 325 mg by mouth daily with breakfast.    Historical Provider, MD  levocetirizine (XYZAL) 5 MG tablet Take 5 mg by mouth every evening.    Historical Provider, MD  montelukast (SINGULAIR) 10 MG tablet Take 10 mg by mouth at bedtime.    Historical Provider, MD  oxyCODONE-acetaminophen (PERCOCET/ROXICET) 5-325 MG per tablet Take 1 tablet by mouth every 4 (four) hours as needed for severe pain. 01/31/14   Hilario Quarryanielle S Ray, MD   Triage Vitals: BP 118/77 mmHg  Pulse 99  Temp(Src) 98 F (36.7 C) (Oral)  Resp 16  SpO2 97%  LMP 04/13/2014 (Approximate)   Physical Exam  Constitutional: She is oriented to person, place, and time. She appears well-developed and well-nourished.  HENT:  Head: Normocephalic.  Mouth/Throat: Oropharynx is clear and moist.  Eyes: EOM are normal.  Neck: Normal range of motion.  Pulmonary/Chest: Effort normal.  No strider  Diminished breath sounds throughout  Abdominal: She exhibits no distension.  Musculoskeletal: Normal range of motion.  No pedal edema  Lymphadenopathy:    She has no cervical adenopathy.  Neurological: She is alert and oriented to person, place, and time.  Psychiatric: She has a normal mood and affect.  Nursing note and vitals reviewed.   ED Course  Procedures (including critical care time)  DIAGNOSTIC STUDIES:  COORDINATION OF CARE: 12:43 AM- Will give benadryl, pepcid, solu-medrol, fluids, and proventil. Will order CBC and CMP. Discussed treatment plan with pt at bedside and pt agreed to plan.     Labs Review Labs Reviewed  CBC WITH DIFFERENTIAL - Abnormal; Notable for the following:    Hemoglobin 11.3 (*)    HCT 34.5 (*)    All other components within normal limits  BASIC METABOLIC PANEL - Abnormal; Notable for the following:    Potassium 3.5 (*)    Glucose, Bld 100 (*)    Anion gap 17 (*)    All other components within  normal limits    Imaging Review No results found.   EKG Interpretation None      MDM   Final diagnoses:  None   Monique Acosta is a 38 y.o. female here with allergic reaction. I don't think she has anaphylaxis at this point. Given benadryl, steroids, pepcid, nebs. Observed for 2 hours. Felt better. Will d/c home with steroids. Told her to keep epi pen on her at all times.   I personally performed the services described in this documentation, which was scribed in my presence. The recorded information has been reviewed and is accurate.    Monique Canalavid H Yao, MD 06/27/14 [id number]

## 2014-06-27 NOTE — Discharge Instructions (Signed)
Take prednisone as prescribed.  Avoid cleaning solutions.   Take benadryl 25 mg every 6 hrs as needed.   Keep epi pen on you at all times. Give yourself a shot if you have trouble breathing.   Follow up with your doctor.   Return to ER if you have trouble breathing, throat closing.

## 2014-06-27 NOTE — ED Notes (Addendum)
Pt reports she was visiting someone upstairs and started coughing and feeling her chest tighten up. Pt c/o cp and sob and itching in her throat. Pt sts this feels like an allergic rx. Unknown if she was exposed to anything she is allergic to. Pt also c/o dizziness upon standing.

## 2014-10-07 ENCOUNTER — Emergency Department (HOSPITAL_COMMUNITY)
Admission: EM | Admit: 2014-10-07 | Discharge: 2014-10-07 | Disposition: A | Discharge status: Home / Self Care | Payer: PRIVATE HEALTH INSURANCE | Attending: Emergency Medicine | Admitting: Emergency Medicine

## 2014-10-07 ENCOUNTER — Encounter (HOSPITAL_COMMUNITY): Payer: Self-pay | Admitting: *Deleted

## 2014-10-07 DIAGNOSIS — F419 Anxiety disorder, unspecified: Secondary | ICD-10-CM | POA: Diagnosis not present

## 2014-10-07 DIAGNOSIS — M549 Dorsalgia, unspecified: Secondary | ICD-10-CM | POA: Diagnosis present

## 2014-10-07 DIAGNOSIS — Z8701 Personal history of pneumonia (recurrent): Secondary | ICD-10-CM | POA: Diagnosis not present

## 2014-10-07 DIAGNOSIS — Z7951 Long term (current) use of inhaled steroids: Secondary | ICD-10-CM | POA: Insufficient documentation

## 2014-10-07 DIAGNOSIS — G8929 Other chronic pain: Secondary | ICD-10-CM | POA: Insufficient documentation

## 2014-10-07 DIAGNOSIS — Z7952 Long term (current) use of systemic steroids: Secondary | ICD-10-CM | POA: Diagnosis not present

## 2014-10-07 DIAGNOSIS — Z3202 Encounter for pregnancy test, result negative: Secondary | ICD-10-CM | POA: Diagnosis not present

## 2014-10-07 DIAGNOSIS — Z87440 Personal history of urinary (tract) infections: Secondary | ICD-10-CM | POA: Insufficient documentation

## 2014-10-07 DIAGNOSIS — Z8679 Personal history of other diseases of the circulatory system: Secondary | ICD-10-CM | POA: Insufficient documentation

## 2014-10-07 DIAGNOSIS — E559 Vitamin D deficiency, unspecified: Secondary | ICD-10-CM | POA: Insufficient documentation

## 2014-10-07 DIAGNOSIS — N390 Urinary tract infection, site not specified: Secondary | ICD-10-CM | POA: Diagnosis not present

## 2014-10-07 DIAGNOSIS — Z79899 Other long term (current) drug therapy: Secondary | ICD-10-CM | POA: Diagnosis not present

## 2014-10-07 DIAGNOSIS — F4310 Post-traumatic stress disorder, unspecified: Secondary | ICD-10-CM | POA: Diagnosis not present

## 2014-10-07 DIAGNOSIS — Z880 Allergy status to penicillin: Secondary | ICD-10-CM | POA: Insufficient documentation

## 2014-10-07 LAB — CBC WITH DIFFERENTIAL/PLATELET
BASOS ABS: 0 10*3/uL (ref 0.0–0.1)
BASOS PCT: 0 % (ref 0–1)
EOS ABS: 0.1 10*3/uL (ref 0.0–0.7)
EOS PCT: 3 % (ref 0–5)
HCT: 34.4 % — ABNORMAL LOW (ref 36.0–46.0)
Hemoglobin: 11.3 g/dL — ABNORMAL LOW (ref 12.0–15.0)
Lymphocytes Relative: 54 % — ABNORMAL HIGH (ref 12–46)
Lymphs Abs: 1.7 10*3/uL (ref 0.7–4.0)
MCH: 25.9 pg — AB (ref 26.0–34.0)
MCHC: 32.8 g/dL (ref 30.0–36.0)
MCV: 78.9 fL (ref 78.0–100.0)
Monocytes Absolute: 0.3 10*3/uL (ref 0.1–1.0)
Monocytes Relative: 9 % (ref 3–12)
Neutro Abs: 1.1 10*3/uL — ABNORMAL LOW (ref 1.7–7.7)
Neutrophils Relative %: 34 % — ABNORMAL LOW (ref 43–77)
PLATELETS: 270 10*3/uL (ref 150–400)
RBC: 4.36 MIL/uL (ref 3.87–5.11)
RDW: 15.6 % — AB (ref 11.5–15.5)
WBC: 3.2 10*3/uL — ABNORMAL LOW (ref 4.0–10.5)

## 2014-10-07 LAB — URINALYSIS, ROUTINE W REFLEX MICROSCOPIC
Bilirubin Urine: NEGATIVE
Glucose, UA: NEGATIVE mg/dL
Hgb urine dipstick: NEGATIVE
KETONES UR: NEGATIVE mg/dL
NITRITE: POSITIVE — AB
Protein, ur: NEGATIVE mg/dL
Specific Gravity, Urine: 1.021 (ref 1.005–1.030)
Urobilinogen, UA: 1 mg/dL (ref 0.0–1.0)
pH: 7 (ref 5.0–8.0)

## 2014-10-07 LAB — I-STAT CHEM 8, ED
BUN: 15 mg/dL (ref 6–23)
Calcium, Ion: 1.21 mmol/L (ref 1.12–1.23)
Chloride: 104 mmol/L (ref 96–112)
Creatinine, Ser: 0.6 mg/dL (ref 0.50–1.10)
Glucose, Bld: 97 mg/dL (ref 70–99)
HCT: 38 % (ref 36.0–46.0)
HEMOGLOBIN: 12.9 g/dL (ref 12.0–15.0)
POTASSIUM: 3.4 mmol/L — AB (ref 3.5–5.1)
SODIUM: 141 mmol/L (ref 135–145)
TCO2: 23 mmol/L (ref 0–100)

## 2014-10-07 LAB — URINE MICROSCOPIC-ADD ON

## 2014-10-07 LAB — POC URINE PREG, ED: Preg Test, Ur: NEGATIVE

## 2014-10-07 MED ORDER — LEVOFLOXACIN 750 MG PO TABS
750.0000 mg | ORAL_TABLET | Freq: Every day | ORAL | Status: AC
Start: 2014-10-07 — End: ?

## 2014-10-07 MED ORDER — DEXTROSE 5 % IV SOLN
1.0000 g | Freq: Once | INTRAVENOUS | Status: DC
  Filled 2014-10-07: qty 10

## 2014-10-07 MED ORDER — LEVOFLOXACIN IN D5W 750 MG/150ML IV SOLN
750.0000 mg | Freq: Once | INTRAVENOUS | Status: AC
  Administered 2014-10-07: 750 mg via INTRAVENOUS
  Filled 2014-10-07: qty 150

## 2014-10-07 MED ORDER — DIPHENHYDRAMINE HCL 50 MG/ML IJ SOLN
12.5000 mg | INTRAMUSCULAR | Status: AC
  Administered 2014-10-07: 12.5 mg via INTRAVENOUS
  Filled 2014-10-07: qty 1

## 2014-10-07 MED ORDER — ONDANSETRON HCL 4 MG PO TABS: 4.0000 mg | ORAL_TABLET | Freq: Three times a day (TID) | ORAL | Status: AC | PRN

## 2014-10-07 MED ORDER — SODIUM CHLORIDE 0.9 % IV BOLUS (SEPSIS)
1000.0000 mL | Freq: Once | INTRAVENOUS | Status: AC
Start: 2014-10-07 — End: 2014-10-07
  Administered 2014-10-07: 1000 mL via INTRAVENOUS

## 2014-10-07 MED ORDER — MORPHINE SULFATE 4 MG/ML IJ SOLN
4.0000 mg | Freq: Once | INTRAMUSCULAR | Status: AC
  Administered 2014-10-07: 4 mg via INTRAVENOUS
  Filled 2014-10-07: qty 1

## 2014-10-07 MED ORDER — ONDANSETRON HCL 4 MG/2ML IJ SOLN
4.0000 mg | Freq: Once | INTRAMUSCULAR | Status: AC
  Administered 2014-10-07: 4 mg via INTRAVENOUS
  Filled 2014-10-07: qty 2

## 2014-10-07 NOTE — ED Notes (Signed)
Pt reports recent UTI 2-3 weeks ago. Now has severe lower back pain, left side back pain, nausea and fever/sweats.

## 2014-10-07 NOTE — ED Notes (Signed)
Attempted IV start x2, unsuccessful. 

## 2014-10-07 NOTE — ED Provider Notes (Signed)
3:35 PM BP 109/94 mmHg  Pulse 87  Temp(Src) 97.9 F (36.6 C) (Oral)  Resp 15  SpO2 95%  LMP 09/09/2014 39 year old female here with abdominal pain, back pain. Positive urinary tract infection. Chest receiving IV Levaquin. At this time. Patient will be discharged when labs are finished. Patient received in hand often PA Laveda Normanran.  CT negative. D/c with supportive care and follow up with primary care.  Patient is nontoxic, nonseptic appearing, in no apparent distress.  Patient's pain and other symptoms adequately managed in emergency department.  Fluid bolus given.  Labs, imaging and vitals reviewed.  Patient does not meet the SIRS or Sepsis criteria.  On repeat exam patient does not have a surgical abdomin and there are no peritoneal signs.  No indication of appendicitis, bowel obstruction, bowel perforation, cholecystitis, diverticulitis, PID or ectopic pregnancy.  Patient discharged home with symptomatic treatment and given strict instructions for follow-up with their primary care physician.  I have also discussed reasons to return immediately to the ER.  Patient expresses understanding and agrees with plan.   Pt HA treated and improved while in ED.  Presentation is like pts typical HA and non concerning [facility], ICH, Meningitis, or temporal arteritis. Pt is afebrile with no focal neuro deficits, nuchal rigidity, or change in vision. Pt is to follow up with PCP to discuss prophylactic medication. Pt verbalizes understanding and is agreeable with plan to dc.    Arthor Captainbigail Osualdo Hansell, PA-C 10/08/14 1148  Nathan R. Rubin PayorPickering, MD 10/08/14 [phone]

## 2014-10-07 NOTE — ED Notes (Signed)
Pt c/o itching from antibiotic.

## 2014-10-07 NOTE — ED Notes (Signed)
NAD noted. Pt A&OX4. Pt discharged in wheelchair.

## 2014-10-07 NOTE — Discharge Instructions (Signed)

## 2014-10-07 NOTE — ED Notes (Signed)
Pt states she can not void at this time 

## 2014-10-07 NOTE — ED Provider Notes (Signed)
CSN: [id number]     Arrival date & time 10/07/14  1037 History   First MD Initiated Contact with Patient 10/07/14 1044     No chief complaint on file.    (Consider location/radiation/quality/duration/timing/severity/associated sxs/prior Treatment) HPI   39 year old female with history of chronic back pain, daily headache, frequent UTI, and anxiety who presents for evaluation of back pain.  Patient admits that she has history of recurrent urinary tract infection, last infection was 3 weeks ago treated with Levaquin. For the past week she endorses low back pain, pain in the rib cage, having dysuria, chills, nausea without vomiting or diarrhea, occasional cough and some ear discomfort. Symptom has been waxing waning worsening since last night. Patient states she feels weak and having increased chills. Her cough is worse at night. She tries follow-up with her PCP today for her symptoms but was told to go to the ER for further care. She did not see a provider.  Past Medical History  Diagnosis Date  . Vitamin D deficiency   . Anxiety   . Anginal pain     "not until today" (01/15/2013)  . Pneumonia 06/2012  . Daily headache   . Chronic lower back pain   . PTSD (post-traumatic stress disorder)   . Frequent UTI   . Peripheral neuropathy    Past Surgical History  Procedure Laterality Date  . Back surgery    . Knee arthroscopy Left 1980's  . Shoulder arthroscopy w/ rotator cuff repair Left 1990's  . Tubal ligation  2008  . Replacement disc anterior lumbar spine  2009   No family history on file. History  Substance Use Topics  . Smoking status: Never Smoker   . Smokeless tobacco: Never Used  . Alcohol Use: Yes     Comment: occasional   OB History    No data available     Review of Systems  Constitutional: Positive for chills. Negative for fever.  Respiratory: Negative for shortness of breath.   Cardiovascular: Negative for chest pain.  Gastrointestinal: Negative for abdominal pain.   Genitourinary: Positive for dysuria.  Musculoskeletal: Positive for back pain.  Skin: Negative for rash.  All other systems reviewed and are negative.     Allergies  Bee venom; Imitrex; Other; Flagyl; Azithromycin; Clindamycin/lincomycin; Keflex; Macrodantin; Penicillins; and Sulfa antibiotics  Home Medications   Prior to Admission medications   Medication Sig Start Date End Date Taking? Authorizing Provider  albuterol (PROVENTIL HFA;VENTOLIN HFA) 108 (90 BASE) MCG/ACT inhaler Inhale 2 puffs into the lungs every 6 (six) hours as needed for wheezing.    Historical Provider, MD  azelastine (ASTELIN) 0.1 % nasal spray Place 1 spray into both nostrils 2 (two) times daily. Use in each nostril as directed    Historical Provider, MD  azelastine (OPTIVAR) 0.05 % ophthalmic solution Place 1 drop into both eyes 2 (two) times daily.    Historical Provider, MD  budesonide-formoterol (SYMBICORT) 80-4.5 MCG/ACT inhaler Inhale 2 puffs into the lungs 2 (two) times daily as needed (for shortness of breath).    Historical Provider, MD  Cholecalciferol (VITAMIN D) 2000 UNITS CAPS Take 2,000 Units by mouth daily.    Historical Provider, MD  citalopram (CELEXA) 10 MG tablet Take 10 mg by mouth daily.    Historical Provider, MD  clonazePAM (KLONOPIN) 0.5 MG tablet Take 0.5 mg by mouth 2 (two) times daily as needed for anxiety.    Historical Provider, MD  EPINEPHrine 0.3 mg/0.3 mL IJ SOAJ injection Inject 0.3 mg  into the muscle as needed (for allergies).    Historical Provider, MD  ferrous sulfate 325 (65 FE) MG tablet Take 325 mg by mouth daily with breakfast.    Historical Provider, MD  levocetirizine (XYZAL) 5 MG tablet Take 5 mg by mouth every evening.    Historical Provider, MD  loratadine (CLARITIN) 10 MG tablet Take 10 mg by mouth daily.    Historical Provider, MD  montelukast (SINGULAIR) 10 MG tablet Take 10 mg by mouth at bedtime.    Historical Provider, MD  ondansetron (ZOFRAN) 8 MG tablet Take 1  tablet (8 mg total) by mouth every 8 (eight) hours as needed for nausea or vomiting. 06/15/14   Rodolph BongEvan S Corey, MD  oxyCODONE-acetaminophen (PERCOCET/ROXICET) 5-325 MG per tablet Take 1 tablet by mouth every 4 (four) hours as needed for severe pain. 01/31/14   Hilario Quarryanielle S Ray, MD  predniSONE (DELTASONE) 20 MG tablet Take 60 mg daily x 2 days then 40 mg daily x 2 days then 20 mg daily x 2 days 06/27/14   Richardean Canalavid H Yao, MD  zolpidem (AMBIEN) 10 MG tablet Take 10 mg by mouth at bedtime as needed for sleep.     Historical Provider, MD   There were no vitals taken for this visit. Physical Exam  Constitutional: She appears well-developed and well-nourished. No distress.  African-American female, laying in a fetal position appears uncomfortable but nontoxic in appearance.  HENT:  Head: Atraumatic.  Mouth/Throat: Oropharynx is clear and moist.  Eyes: Conjunctivae are normal.  Neck: Neck supple.  Cardiovascular: Normal rate and regular rhythm.   Pulmonary/Chest: Effort normal and breath sounds normal.  Abdominal: Soft. She exhibits no distension. There is no tenderness (Mild suprapubic tenderness no guarding no rebound tenderness.).  Genitourinary:  Tenderness to CVA, L>R.    Neurological: She is alert.  Skin: No rash noted.  Psychiatric: She has a normal mood and affect.  Nursing note and vitals reviewed.   ED Course  Procedures (including critical care time)  11:10 AM  History of recurrent urinary tract infection here with dysuria and back pain. Symptoms may suggest pyelonephritis. She is afebrile, vital signs stable, and have a nonsurgical abdomen. Workup initiated, IV fluid and symptom treatment provided.  3:41 PM UA positive for UTI.  Pt allergic to many abx, but can tolerates Levaquin.  Give give Levaquin in ER via IV.  Pt able to tolerates PO and will be discharged with Levaquin x 7 days.  Urine culture sent.  Return precaution discussed.  Care discussed with Dr. Deretha EmoryZackowski.  Care discussed  with oncoming provider who will d/c pt once IV abx finish.   Labs Review Labs Reviewed  URINALYSIS, ROUTINE W REFLEX MICROSCOPIC - Abnormal; Notable for the following:    APPearance CLOUDY (*)    Nitrite POSITIVE (*)    Leukocytes, UA MODERATE (*)    All other components within normal limits  CBC WITH DIFFERENTIAL/PLATELET - Abnormal; Notable for the following:    WBC 3.2 (*)    Hemoglobin 11.3 (*)    HCT 34.4 (*)    MCH 25.9 (*)    RDW 15.6 (*)    Neutrophils Relative % 34 (*)    Neutro Abs 1.1 (*)    Lymphocytes Relative 54 (*)    All other components within normal limits  URINE MICROSCOPIC-ADD ON - Abnormal; Notable for the following:    Squamous Epithelial / LPF FEW (*)    Bacteria, UA FEW (*)    All other  components within normal limits  I-STAT CHEM 8, ED - Abnormal; Notable for the following:    Potassium 3.4 (*)    All other components within normal limits  POC URINE PREG, ED  POC URINE PREG, ED    Imaging Review No results found.   EKG Interpretation None      MDM   Final diagnoses:  UTI (lower urinary tract infection)    BP 109/94 mmHg  Pulse 87  Temp(Src) 97.9 F (36.6 C) (Oral)  Resp 15  SpO2 95%  LMP 09/09/2014     Fayrene Helper, PA-C 10/07/14 1543  Vanetta Mulders, MD 10/08/14 [phone]

## 2014-10-07 NOTE — ED Notes (Signed)
Another RN attempted IV and also ultrasound done. Notified EDP.

## 2014-10-07 NOTE — ED Notes (Signed)
Pt undressed, in gown, on continuous pulse oximetry and blood pressure cuff; visitor at bedside; warm blankets given

## 2014-12-29 IMAGING — CR DG HAND COMPLETE 3+V*L*
3 series · 3 of 3 positions shown · non-contrast
Comparison: None

CLINICAL DATA: Dog bite yesterday, puncture at left wrist, pain,
swelling

EXAM:
LEFT HAND - COMPLETE 3+ VIEW

[view not recorded (1 of 3)]
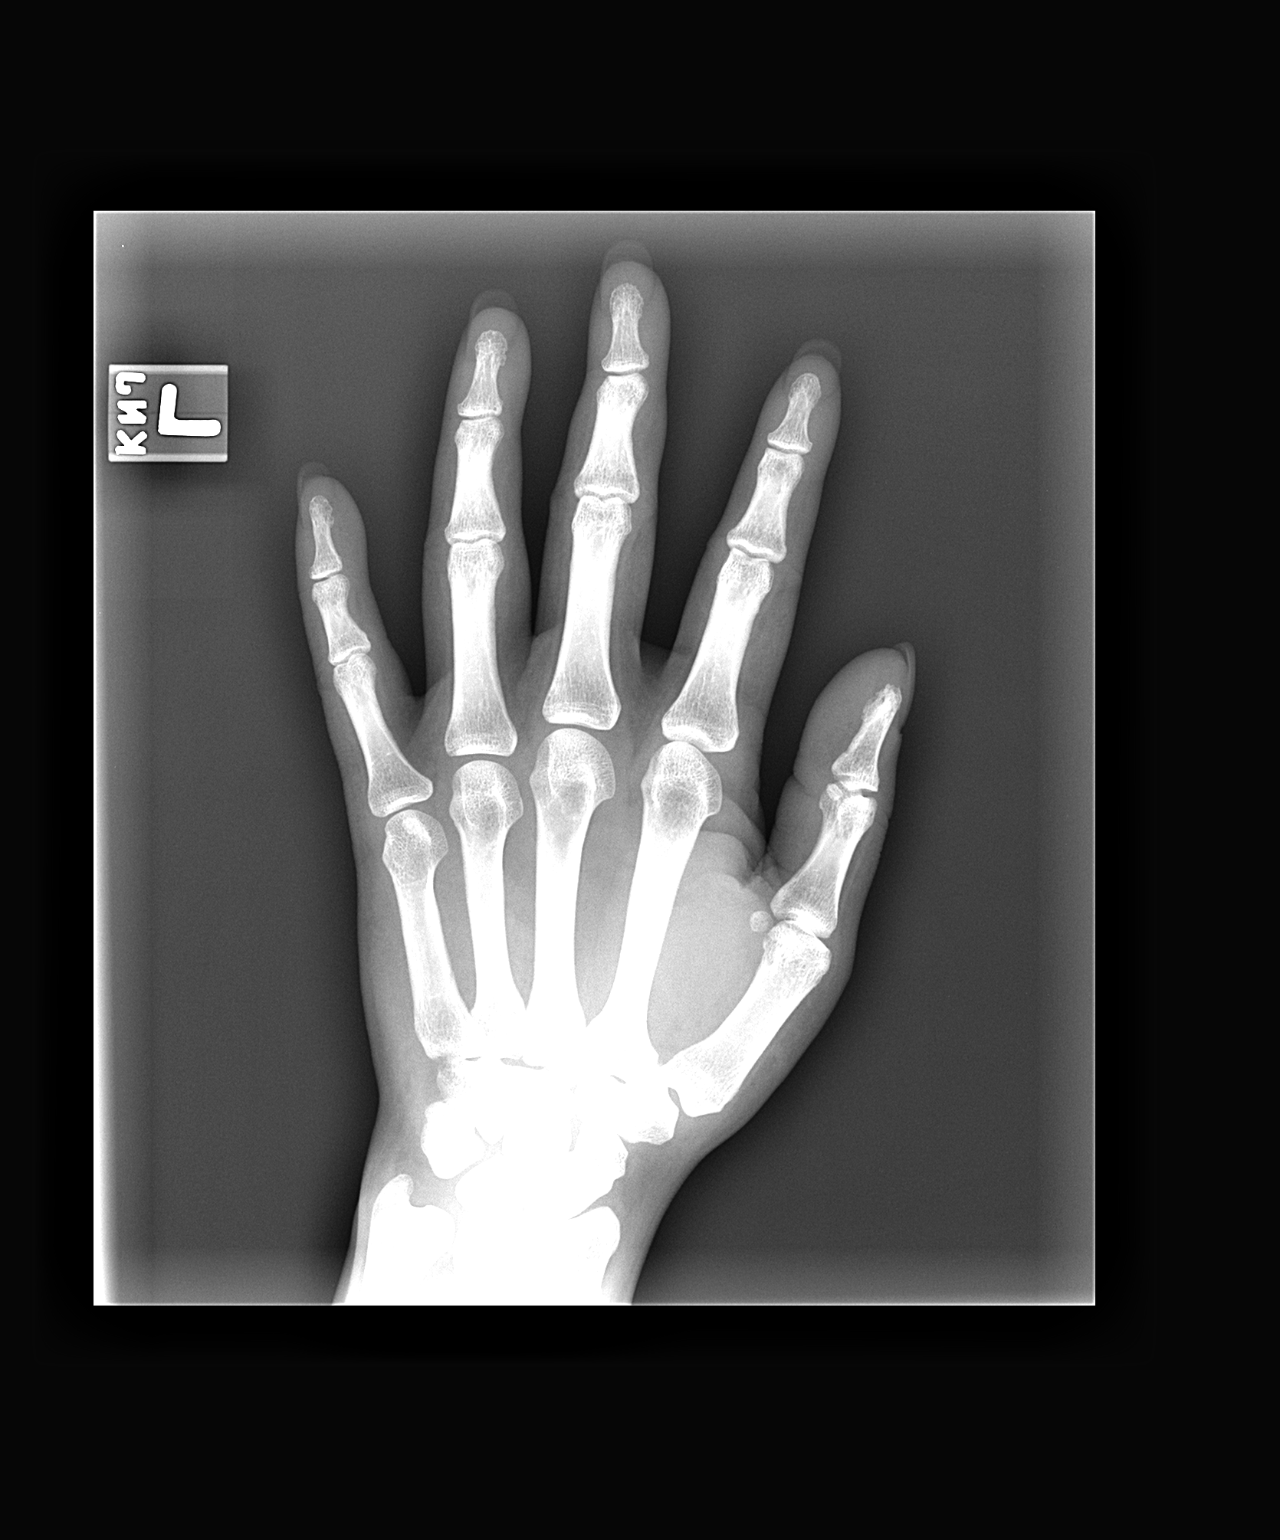

[view not recorded (2 of 3)]
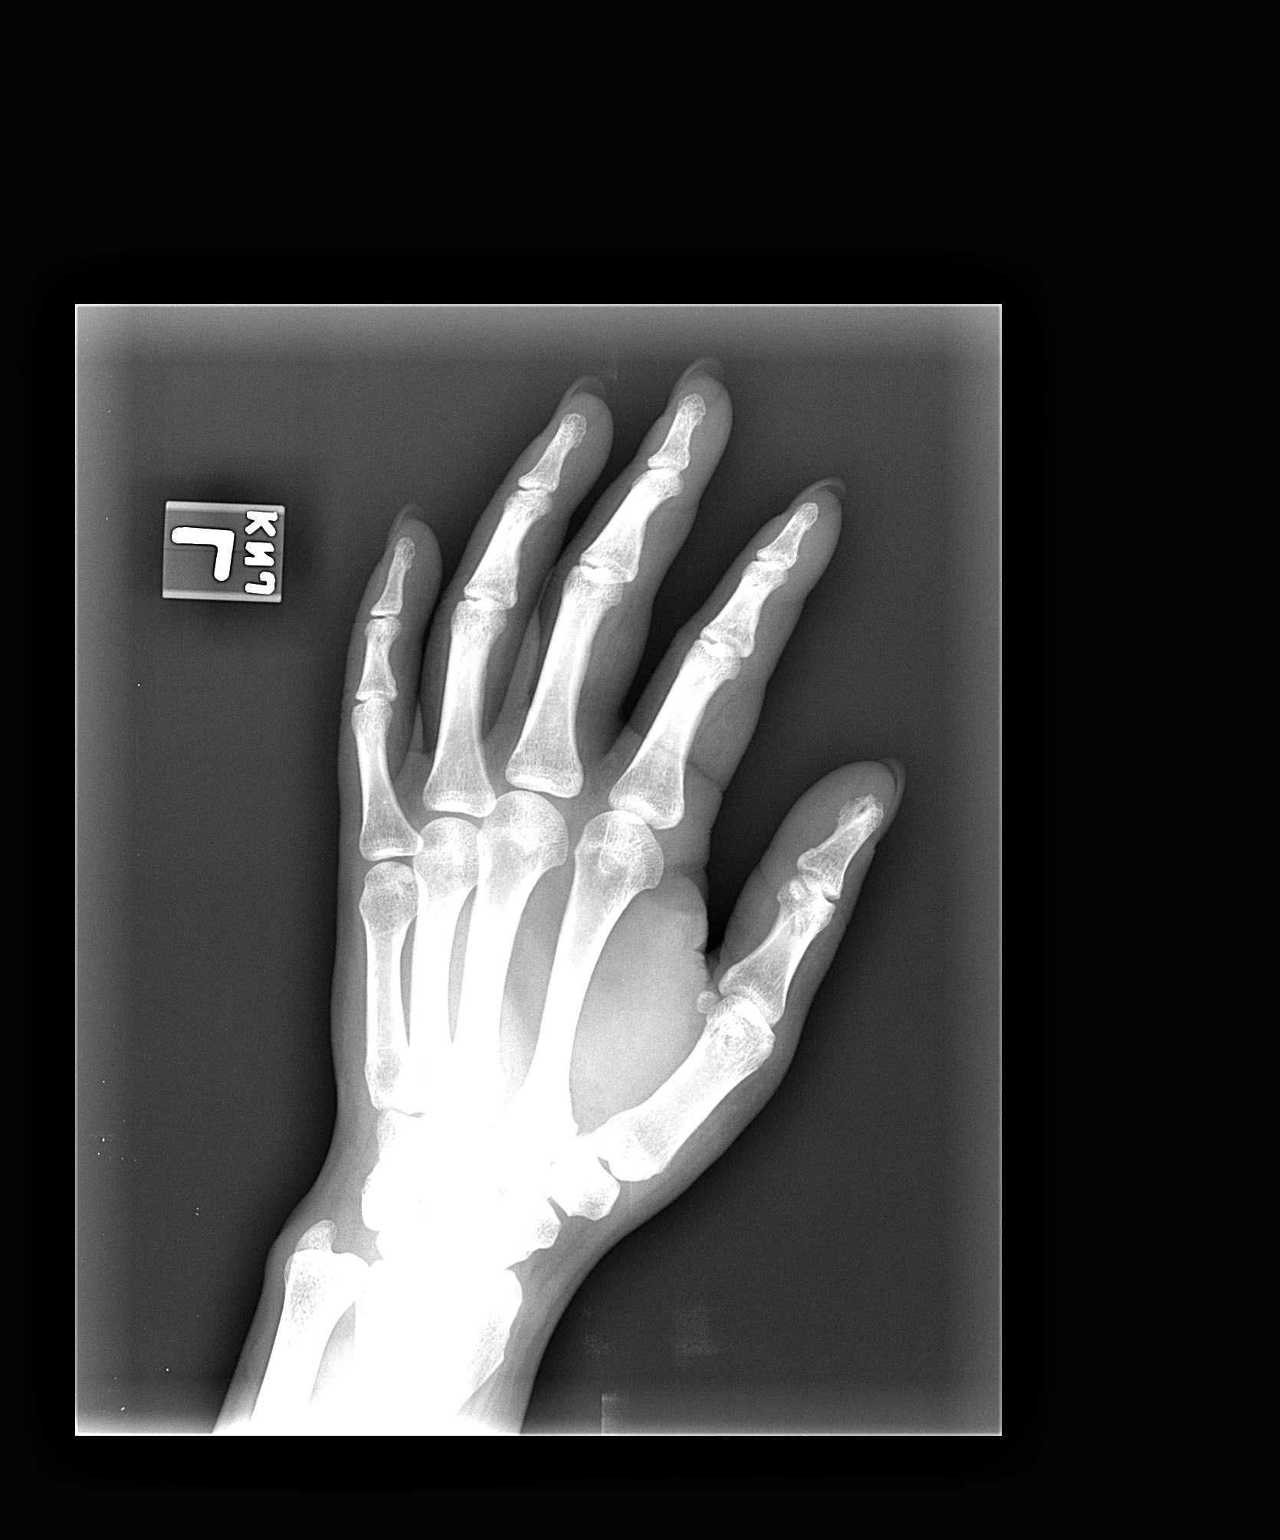

[view not recorded (3 of 3)]
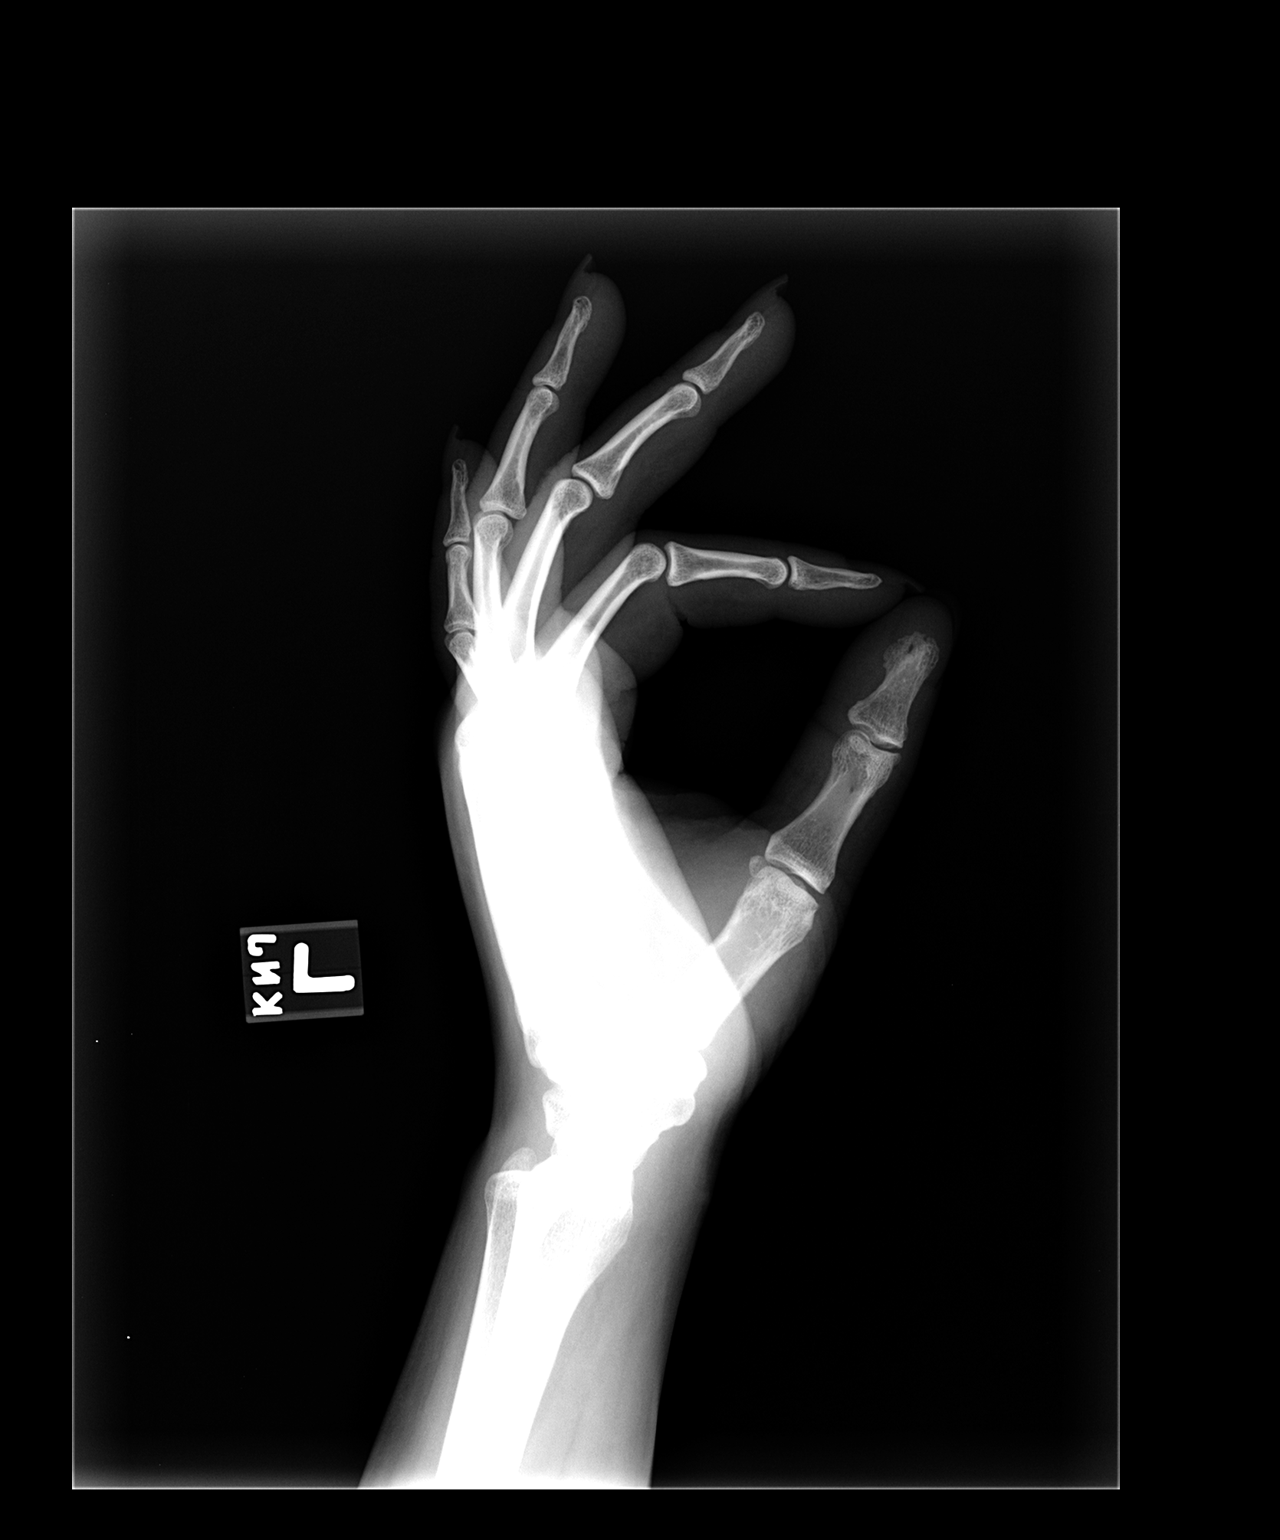

[3 of 3 positions shown; findings below may reference images not displayed]

FINDINGS: Osseous mineralization normal.

Joint spaces preserved.

No fracture, dislocation, or bone destruction.

No soft tissue gas or radiopaque foreign body.
IMPRESSION: No acute abnormalities.

## 2015-04-10 ENCOUNTER — Emergency Department (HOSPITAL_COMMUNITY)
Admission: EM | Admit: 2015-04-10 | Discharge: 2015-04-10 | Disposition: A | Discharge status: Home / Self Care | Payer: Self-pay | Attending: Emergency Medicine | Admitting: Emergency Medicine

## 2015-04-10 ENCOUNTER — Encounter (HOSPITAL_COMMUNITY): Payer: Self-pay | Admitting: Emergency Medicine

## 2015-04-10 DIAGNOSIS — Z8701 Personal history of pneumonia (recurrent): Secondary | ICD-10-CM | POA: Insufficient documentation

## 2015-04-10 DIAGNOSIS — Z880 Allergy status to penicillin: Secondary | ICD-10-CM | POA: Insufficient documentation

## 2015-04-10 DIAGNOSIS — G8929 Other chronic pain: Secondary | ICD-10-CM | POA: Insufficient documentation

## 2015-04-10 DIAGNOSIS — B373 Candidiasis of vulva and vagina: Secondary | ICD-10-CM | POA: Insufficient documentation

## 2015-04-10 DIAGNOSIS — Z3202 Encounter for pregnancy test, result negative: Secondary | ICD-10-CM | POA: Insufficient documentation

## 2015-04-10 DIAGNOSIS — Z79899 Other long term (current) drug therapy: Secondary | ICD-10-CM | POA: Insufficient documentation

## 2015-04-10 DIAGNOSIS — B3731 Acute candidiasis of vulva and vagina: Secondary | ICD-10-CM

## 2015-04-10 DIAGNOSIS — Z8669 Personal history of other diseases of the nervous system and sense organs: Secondary | ICD-10-CM | POA: Insufficient documentation

## 2015-04-10 DIAGNOSIS — Z87440 Personal history of urinary (tract) infections: Secondary | ICD-10-CM | POA: Insufficient documentation

## 2015-04-10 DIAGNOSIS — I209 Angina pectoris, unspecified: Secondary | ICD-10-CM | POA: Insufficient documentation

## 2015-04-10 DIAGNOSIS — Z7951 Long term (current) use of inhaled steroids: Secondary | ICD-10-CM | POA: Insufficient documentation

## 2015-04-10 DIAGNOSIS — F419 Anxiety disorder, unspecified: Secondary | ICD-10-CM | POA: Insufficient documentation

## 2015-04-10 DIAGNOSIS — E559 Vitamin D deficiency, unspecified: Secondary | ICD-10-CM | POA: Insufficient documentation

## 2015-04-10 LAB — URINE MICROSCOPIC-ADD ON

## 2015-04-10 LAB — WET PREP, GENITAL: Trich, Wet Prep: NONE SEEN

## 2015-04-10 LAB — URINALYSIS, ROUTINE W REFLEX MICROSCOPIC
BILIRUBIN URINE: NEGATIVE
Glucose, UA: NEGATIVE mg/dL
Hgb urine dipstick: NEGATIVE
KETONES UR: NEGATIVE mg/dL
NITRITE: NEGATIVE
PH: 6.5 (ref 5.0–8.0)
PROTEIN: NEGATIVE mg/dL
Specific Gravity, Urine: 1.023 (ref 1.005–1.030)
Urobilinogen, UA: 1 mg/dL (ref 0.0–1.0)

## 2015-04-10 LAB — PREGNANCY, URINE: PREG TEST UR: NEGATIVE

## 2015-04-10 MED ORDER — FLUCONAZOLE 150 MG PO TABS: ORAL_TABLET | ORAL | Status: AC

## 2015-04-10 MED ORDER — FLUCONAZOLE 150 MG PO TABS
150.0000 mg | ORAL_TABLET | Freq: Once | ORAL | Status: AC
  Administered 2015-04-10: 150 mg via ORAL
  Filled 2015-04-10: qty 1

## 2015-04-10 NOTE — ED Provider Notes (Signed)
CSN: [id number]     Arrival date & time 04/10/15  1230 History   First MD Initiated Contact with Patient 04/10/15 1409     Chief Complaint  Patient presents with  . Urinary Tract Infection     (Consider location/radiation/quality/duration/timing/severity/associated sxs/prior Treatment) The history is provided by the patient and medical records. No language interpreter was used.     Monique Acosta is a 39 y.o. female  with a hx of anxiety, chronic low back pain, PTSD, recurrent UTI, primary colon cancer, primary breast cancer (both only managed surgically at this time) presents to the Emergency Department complaining of gradual, persistent, progressively worsening full flow vaginal itching approximately one week ago.  Pt reports she is on abx due to "prepping for surgery."  She reports sexual intercourse 1 week ago with dysuria that was resolved with cranberry juice.  Three days ago she began to have vaginal itching that she treated with OTC Monostat with some improvement.  She reports that her internal vaginal vault feels irritated.  She denies dysuria but her labia feel uncomfortable.  She has also done cool compresses with only temporary relief.  Patient denies fever, chills, headache, neck pain, chest pain, shortness of breath, abdominal pain, nausea, vomiting, diarrhea, weakness, dizziness, syncope, hematuria.  Past Medical History  Diagnosis Date  . Vitamin D deficiency   . Anxiety   . Anginal pain     "not until today" (01/15/2013)  . Pneumonia 06/2012  . Daily headache   . Chronic lower back pain   . PTSD (post-traumatic stress disorder)   . Frequent UTI   . Peripheral neuropathy    Past Surgical History  Procedure Laterality Date  . Back surgery    . Knee arthroscopy Left 1980's  . Shoulder arthroscopy w/ rotator cuff repair Left 1990's  . Tubal ligation  2008  . Replacement disc anterior lumbar spine  2009   No family history on file. Social History  Substance Use Topics   . Smoking status: Never Smoker   . Smokeless tobacco: Never Used  . Alcohol Use: Yes     Comment: occasional   OB History    No data available     Review of Systems  Constitutional: Negative for fever, diaphoresis, appetite change, fatigue and unexpected weight change.  HENT: Negative for mouth sores.   Eyes: Negative for visual disturbance.  Respiratory: Negative for cough, chest tightness, shortness of breath and wheezing.   Cardiovascular: Negative for chest pain.  Gastrointestinal: Negative for nausea, vomiting, abdominal pain, diarrhea and constipation.  Endocrine: Negative for polydipsia, polyphagia and polyuria.  Genitourinary: Positive for vaginal discharge and vaginal pain. Negative for dysuria, urgency, frequency and hematuria.  Musculoskeletal: Negative for back pain and neck stiffness.  Skin: Negative for rash.  Allergic/Immunologic: Negative for immunocompromised state.  Neurological: Negative for syncope, light-headedness and headaches.  Hematological: Does not bruise/bleed easily.  Psychiatric/Behavioral: Negative for sleep disturbance. The patient is not nervous/anxious.       Allergies  Bee venom; Beesix ; Flagyl; Imitrex; Nitrofurantoin; Other; Vancomycin; Azithromycin; Clindamycin/lincomycin; Keflex; Macrodantin; Penicillins; and Sulfa antibiotics  Home Medications   Prior to Admission medications   Medication Sig Start Date End Date Taking? Authorizing Provider  albuterol (PROVENTIL HFA;VENTOLIN HFA) 108 (90 BASE) MCG/ACT inhaler Inhale 2 puffs into the lungs every 6 (six) hours as needed for wheezing.   Yes Historical Provider, MD  budesonide-formoterol (SYMBICORT) 80-4.5 MCG/ACT inhaler Inhale 2 puffs into the lungs 2 (two) times daily as needed (for shortness  of breath).   Yes Historical Provider, MD  Cholecalciferol (VITAMIN D) 2000 UNITS CAPS Take 2,000 Units by mouth daily.   Yes Historical Provider, MD  citalopram (CELEXA) 40 MG tablet Take 20 mg by  mouth daily.   Yes Historical Provider, MD  clonazePAM (KLONOPIN) 0.5 MG tablet Take 0.5 mg by mouth 2 (two) times daily as needed for anxiety.   Yes Historical Provider, MD  EPINEPHrine 0.3 mg/0.3 mL IJ SOAJ injection Inject 0.3 mg into the muscle as needed (for allergies).   Yes Historical Provider, MD  ferrous sulfate 325 (65 FE) MG tablet Take 325 mg by mouth daily with breakfast.   Yes Historical Provider, MD  loratadine (CLARITIN) 10 MG tablet Take 10 mg by mouth daily.   Yes Historical Provider, MD  montelukast (SINGULAIR) 10 MG tablet Take 10 mg by mouth at bedtime.   Yes Historical Provider, MD  Naproxen Sodium 220 MG CAPS Take 6-8 capsules by mouth daily as needed (headache).    Yes Historical Provider, MD  Olopatadine HCl (PATADAY) 0.2 % SOLN Apply 1 drop to eye daily as needed (allergies).   Yes Historical Provider, MD  Tioconazole (MONISTAT 1-DAY VA) Place 1 application vaginally once.   Yes Historical Provider, MD  fluconazole (DIFLUCAN) 150 MG tablet 1 tablet every 72 hours until completed 04/10/15   Dahlia Client Yandell Mcjunkins, PA-C  levofloxacin (LEVAQUIN) 750 MG tablet Take 1 tablet (750 mg total) by mouth daily. X 7 days Patient not taking: Reported on 04/10/2015 10/07/14   Fayrene Helper, PA-C  ondansetron (ZOFRAN) 4 MG tablet Take 1 tablet (4 mg total) by mouth every 8 (eight) hours as needed for nausea or vomiting. Patient not taking: Reported on 04/10/2015 10/07/14   Fayrene Helper, PA-C  oxyCODONE-acetaminophen (PERCOCET/ROXICET) 5-325 MG per tablet Take 1 tablet by mouth every 4 (four) hours as needed for severe pain. Patient not taking: Reported on 10/07/2014 01/31/14   Margarita Grizzle, MD  predniSONE (DELTASONE) 20 MG tablet Take 60 mg daily x 2 days then 40 mg daily x 2 days then 20 mg daily x 2 days Patient not taking: Reported on 10/07/2014 06/27/14   Richardean Canal, MD   BP 125/77 mmHg  Pulse 76  Temp(Src) 98.3 F (36.8 C) (Oral)  Resp 16  SpO2 100%  LMP 03/14/2015 Physical Exam  Constitutional:  She appears well-developed and well-nourished. No distress.  HENT:  Head: Normocephalic and atraumatic.  Eyes: Conjunctivae are normal.  Neck: Normal range of motion.  Cardiovascular: Normal rate, regular rhythm, normal heart sounds and intact distal pulses.   No murmur heard. Pulmonary/Chest: Effort normal and breath sounds normal. No respiratory distress. She has no wheezes.  Abdominal: Soft. Bowel sounds are normal. There is no tenderness. There is no rebound and no guarding. Hernia confirmed negative in the right inguinal area and confirmed negative in the left inguinal area.  Genitourinary: Uterus normal. No labial fusion. There is no rash, tenderness or lesion on the right labia. There is no rash, tenderness or lesion on the left labia. Uterus is not deviated, not enlarged, not fixed and not tender. Cervix exhibits no motion tenderness, no discharge and no friability. Right adnexum displays no mass, no tenderness and no fullness. Left adnexum displays no mass, no tenderness and no fullness. No erythema, tenderness or bleeding in the vagina. No foreign body around the vagina. No signs of injury around the vagina. Vaginal discharge (copious amount of thick white vaginal discharge in the vault and clinging to the vaginal  wall) found.  Musculoskeletal: Normal range of motion. She exhibits no edema.  Lymphadenopathy:       Right: No inguinal adenopathy present.       Left: No inguinal adenopathy present.  Neurological: She is alert.  Skin: Skin is warm and dry. She is not diaphoretic. No erythema.  Psychiatric: She has a normal mood and affect.  Nursing note and vitals reviewed.   ED Course  Procedures (including critical care time) Labs Review Labs Reviewed  WET PREP, GENITAL - Abnormal; Notable for the following:    Yeast Wet Prep HPF POC MANY (*)    Clue Cells Wet Prep HPF POC FEW (*)    WBC, Wet Prep HPF POC MANY (*)    All other components within normal limits  URINALYSIS, ROUTINE  W REFLEX MICROSCOPIC (NOT AT [facility]) - Abnormal; Notable for the following:    APPearance CLOUDY (*)    Leukocytes, UA MODERATE (*)    All other components within normal limits  URINE MICROSCOPIC-ADD ON  PREGNANCY, URINE  GC/CHLAMYDIA PROBE AMP ([place]) NOT AT [facility]    Imaging Review No results found. I have personally reviewed and evaluated these images and lab results as part of my medical decision-making.   EKG Interpretation None      MDM   Final diagnoses:  Vulvovaginitis due to Candida   Dorina Hoyer presents with full vaginal itching and mild dysuria. Exam consistent with significant candidal infection. Will begin giving Diflucan.  No evidence of UTI. Will have patient follow with primary care physician.  Abdomen is soft and nontender. No emesis. No evidence of systemic symptoms.  BP 125/77 mmHg  Pulse 76  Temp(Src) 98.3 F (36.8 C) (Oral)  Resp 16  SpO2 100%  LMP 03/14/2015   Dierdre Forth, PA-C 04/10/15 1630  Mirian Mo, MD 04/11/15 [phone]

## 2015-04-10 NOTE — Discharge Instructions (Signed)
1. Medications: diflucan, usual home medications 2. Treatment: rest, drink plenty of fluids,  3. Follow Up: Please followup with your primary doctor in 7 days for discussion of your diagnoses and further evaluation after today's visit; if you do not have a primary care doctor use the resource guide provided to find one; Please return to the ER for worsening symptoms    Candidal Vulvovaginitis Candidal vulvovaginitis is an infection of the vagina and vulva. The vulva is the skin around the opening of the vagina. This may cause itching and discomfort in and around the vagina.  HOME CARE  Only take medicine as told by your doctor.  Do not have sex (intercourse) until the infection is healed or as told by your doctor.  Practice safe sex.  Tell your sex partner about your infection.  Do not douche or use tampons.  Wear cotton underwear. Do not wear tight pants or panty hose.  Eat yogurt. This may help treat and prevent yeast infections. GET HELP RIGHT AWAY IF:   You have a fever.  Your problems get worse during treatment or do not get better in 3 days.  You have discomfort, irritation, or itching in your vagina or vulva area.  You have pain after sex.  You start to get belly (abdominal) pain. MAKE SURE YOU:  Understand these instructions.  Will watch your condition.  Will get help right away if you are not doing well or get worse. Document Released: 10/20/2008 Document Revised: 07/29/2013 Document Reviewed: 10/20/2008 [facility] Patient Information 2015 [place], [place]. This information is not intended to replace advice given to you by your health care provider. Make sure you discuss any questions you have with your health care provider.

## 2015-04-10 NOTE — ED Notes (Signed)
Per patient states she felt like she was getting a UTI on Monday-now she is has vaginal itching

## 2015-04-13 LAB — GC/CHLAMYDIA PROBE AMP (~~LOC~~) NOT AT ARMC
CHLAMYDIA, DNA PROBE: NEGATIVE
Neisseria Gonorrhea: NEGATIVE

## 2015-11-11 ENCOUNTER — Encounter (HOSPITAL_COMMUNITY): Payer: Self-pay | Admitting: Emergency Medicine

## 2015-11-11 ENCOUNTER — Emergency Department (HOSPITAL_COMMUNITY)
Admission: EM | Admit: 2015-11-11 | Discharge: 2015-11-11 | Disposition: A | Discharge status: Home / Self Care | Payer: 59 | Attending: Emergency Medicine | Admitting: Emergency Medicine

## 2015-11-11 DIAGNOSIS — E559 Vitamin D deficiency, unspecified: Secondary | ICD-10-CM | POA: Diagnosis not present

## 2015-11-11 DIAGNOSIS — Z87440 Personal history of urinary (tract) infections: Secondary | ICD-10-CM | POA: Diagnosis not present

## 2015-11-11 DIAGNOSIS — G8929 Other chronic pain: Secondary | ICD-10-CM | POA: Insufficient documentation

## 2015-11-11 DIAGNOSIS — H53149 Visual discomfort, unspecified: Secondary | ICD-10-CM | POA: Insufficient documentation

## 2015-11-11 DIAGNOSIS — F4310 Post-traumatic stress disorder, unspecified: Secondary | ICD-10-CM | POA: Insufficient documentation

## 2015-11-11 DIAGNOSIS — H9209 Otalgia, unspecified ear: Secondary | ICD-10-CM | POA: Diagnosis not present

## 2015-11-11 DIAGNOSIS — J069 Acute upper respiratory infection, unspecified: Secondary | ICD-10-CM | POA: Diagnosis not present

## 2015-11-11 DIAGNOSIS — Z8701 Personal history of pneumonia (recurrent): Secondary | ICD-10-CM | POA: Diagnosis not present

## 2015-11-11 DIAGNOSIS — Z880 Allergy status to penicillin: Secondary | ICD-10-CM | POA: Diagnosis not present

## 2015-11-11 DIAGNOSIS — B9789 Other viral agents as the cause of diseases classified elsewhere: Secondary | ICD-10-CM

## 2015-11-11 DIAGNOSIS — Z79899 Other long term (current) drug therapy: Secondary | ICD-10-CM | POA: Insufficient documentation

## 2015-11-11 DIAGNOSIS — R05 Cough: Secondary | ICD-10-CM | POA: Diagnosis present

## 2015-11-11 DIAGNOSIS — F419 Anxiety disorder, unspecified: Secondary | ICD-10-CM | POA: Diagnosis not present

## 2015-11-11 LAB — RAPID STREP SCREEN (MED CTR MEBANE ONLY): Streptococcus, Group A Screen (Direct): NEGATIVE

## 2015-11-11 MED ORDER — GUAIFENESIN-DM 100-10 MG/5ML PO SYRP: 5.0000 mL | ORAL_SOLUTION | ORAL | Status: DC | PRN

## 2015-11-11 MED ORDER — KETOROLAC TROMETHAMINE 60 MG/2ML IM SOLN
60.0000 mg | Freq: Once | INTRAMUSCULAR | Status: AC
  Administered 2015-11-11: 60 mg via INTRAMUSCULAR
  Filled 2015-11-11: qty 2

## 2015-11-11 MED ORDER — TRAMADOL HCL 50 MG PO TABS: 50.0000 mg | ORAL_TABLET | Freq: Four times a day (QID) | ORAL | Status: AC | PRN

## 2015-11-11 MED ORDER — IBUPROFEN 600 MG PO TABS: 600.0000 mg | ORAL_TABLET | Freq: Four times a day (QID) | ORAL | Status: AC | PRN

## 2015-11-11 NOTE — ED Provider Notes (Signed)
CSN: [id number]     Arrival date & time 11/11/15  1133 History  By signing my name below, I, Freida Busmaniana Omoyeni, attest that this documentation has been prepared under the direction and in the presence of non-physician practitioner, Jaynie Crumbleatyana Leyland Kenna, PA-C. Electronically Signed: Freida Busmaniana Omoyeni, Scribe. 11/11/2015. 12:52 PM.    Chief Complaint  Patient presents with  . URI   The history is provided by the patient. No language interpreter was used.   HPI Comments:  Monique Acosta is a 40 y.o. female who presents to the Emergency Department complaining of HA since yesterday. She reports associated generalized body aches with increased pain in her neck, sore throat and ear pain. Symptoms started yesterday.  Pt rates her pain a 7/10. She has taken meloxicam, Claritin, and aleve with little relief. Pt denies cough, rhinorrhea, fever, nausea, vomiting, and diarrhea. Pt reports neck pain but no stiffness.   Past Medical History  Diagnosis Date  . Vitamin D deficiency   . Anxiety   . Anginal pain (HCC)     "not until today" (01/15/2013)  . Pneumonia 06/2012  . Daily headache   . Chronic lower back pain   . PTSD (post-traumatic stress disorder)   . Frequent UTI   . Peripheral neuropathy [facility](HCC)    Past Surgical History  Procedure Laterality Date  . Back surgery    . Knee arthroscopy Left 1980's  . Shoulder arthroscopy w/ rotator cuff repair Left 1990's  . Tubal ligation  2008  . Replacement disc anterior lumbar spine  2009   No family history on file. Social History  Substance Use Topics  . Smoking status: Never Smoker   . Smokeless tobacco: Never Used  . Alcohol Use: Yes     Comment: occasional   OB History    No data available     Review of Systems  Constitutional: Negative for fever and chills.  HENT: Positive for ear pain and sore throat. Negative for rhinorrhea.   Eyes: Positive for photophobia.  Respiratory: Negative for cough and shortness of breath.   Cardiovascular: Negative  for chest pain.  Gastrointestinal: Negative for nausea, vomiting, abdominal pain and diarrhea.  Musculoskeletal: Positive for neck pain.  Neurological: Positive for headaches.  All other systems reviewed and are negative.   Allergies  Bee venom; Beesix ; Flagyl; Imitrex; Nitrofurantoin; Other; Vancomycin; Azithromycin; Clindamycin/lincomycin; Keflex; Macrodantin; Penicillins; and Sulfa antibiotics  Home Medications   Prior to Admission medications   Medication Sig Start Date End Date Taking? Authorizing Provider  albuterol (PROVENTIL HFA;VENTOLIN HFA) 108 (90 BASE) MCG/ACT inhaler Inhale 2 puffs into the lungs every 6 (six) hours as needed for wheezing.    Historical Provider, MD  budesonide-formoterol (SYMBICORT) 80-4.5 MCG/ACT inhaler Inhale 2 puffs into the lungs 2 (two) times daily as needed (for shortness of breath).    Historical Provider, MD  Cholecalciferol (VITAMIN D) 2000 UNITS CAPS Take 2,000 Units by mouth daily.    Historical Provider, MD  citalopram (CELEXA) 40 MG tablet Take 20 mg by mouth daily.    Historical Provider, MD  clonazePAM (KLONOPIN) 0.5 MG tablet Take 0.5 mg by mouth 2 (two) times daily as needed for anxiety.    Historical Provider, MD  EPINEPHrine 0.3 mg/0.3 mL IJ SOAJ injection Inject 0.3 mg into the muscle as needed (for allergies).    Historical Provider, MD  ferrous sulfate 325 (65 FE) MG tablet Take 325 mg by mouth daily with breakfast.    Historical Provider, MD  fluconazole (DIFLUCAN) 150  MG tablet 1 tablet every 72 hours until completed 04/10/15   [facility], PA-C  levofloxacin (LEVAQUIN) 750 MG tablet Take 1 tablet (750 mg total) by mouth daily. X 7 days Patient not taking: Reported on 04/10/2015 10/07/14   Fayrene Helper, PA-C  loratadine (CLARITIN) 10 MG tablet Take 10 mg by mouth daily.    Historical Provider, MD  montelukast (SINGULAIR) 10 MG tablet Take 10 mg by mouth at bedtime.    Historical Provider, MD  Naproxen Sodium 220 MG CAPS Take 6-8  capsules by mouth daily as needed (headache).     Historical Provider, MD  Olopatadine HCl (PATADAY) 0.2 % SOLN Apply 1 drop to eye daily as needed (allergies).    Historical Provider, MD  ondansetron (ZOFRAN) 4 MG tablet Take 1 tablet (4 mg total) by mouth every 8 (eight) hours as needed for nausea or vomiting. Patient not taking: Reported on 04/10/2015 10/07/14   Fayrene Helper, PA-C  oxyCODONE-acetaminophen (PERCOCET/ROXICET) 5-325 MG per tablet Take 1 tablet by mouth every 4 (four) hours as needed for severe pain. Patient not taking: Reported on 10/07/2014 01/31/14   Margarita Grizzle, MD  predniSONE (DELTASONE) 20 MG tablet Take 60 mg daily x 2 days then 40 mg daily x 2 days then 20 mg daily x 2 days Patient not taking: Reported on 10/07/2014 06/27/14   Richardean Canal, MD  Tioconazole (MONISTAT 1-DAY VA) Place 1 application vaginally once.    Historical Provider, MD   BP 133/92 mmHg  Pulse 100  Temp(Src) 98.1 F (36.7 C) (Oral)  Resp 18  SpO2 98%  LMP 11/11/2015 Physical Exam  Constitutional: She is oriented to person, place, and time. She appears well-developed and well-nourished. No distress.  HENT:  Head: Normocephalic and atraumatic.  Right Ear: Tympanic membrane, external ear and ear canal normal.  Left Ear: External ear and ear canal normal.  Nose: Mucosal edema and rhinorrhea present.  Mouth/Throat: Uvula is midline and mucous membranes are normal. Posterior oropharyngeal erythema present.  Eyes: Conjunctivae are normal.  Neck: Neck supple.  No meningismus  Cardiovascular: Normal rate, regular rhythm and normal heart sounds.   Pulmonary/Chest: Effort normal and breath sounds normal. No respiratory distress. She has no wheezes.  Abdominal: She exhibits no distension.  Lymphadenopathy:    She has no cervical adenopathy.  Neurological: She is alert and oriented to person, place, and time.  Skin: Skin is warm and dry.  Psychiatric: She has a normal mood and affect.  Nursing note and vitals  reviewed.   ED Course  Procedures   DIAGNOSTIC STUDIES:  Oxygen Saturation is 98% on RA, normal by my interpretation.    COORDINATION OF CARE:  12:13 PM Discussed treatment plan with pt at bedside and pt agreed to plan.  Labs Review Labs Reviewed  RAPID STREP SCREEN (NOT AT [facility])   I have personally reviewed and evaluated these lab results as part of my medical decision-making.   MDM   Pt symptoms consistent with URI. Strep negative  Pt will be discharged with symptomatic treatment.  Discussed return precautions.  Pt is hemodynamically stable & in NAD prior to discharge.  Final diagnoses:  Viral URI with cough    patient with headache, so throat, nasal congestion, body aches. Patient strep screen is negative. No evidence of peritonsillar retropharyngeal abscess. Vital signs are normal. She is afebrile. No meningismus. Plan to discharge home, symptomatically treatment, rest, follow-up with primary care doctor. Most likely viral infection versus influenza.  Filed Vitals:  11/11/15 1139  BP: 133/92  Pulse: 100  Temp: 98.1 F (36.7 C)  TempSrc: Oral  Resp: 18  SpO2: 98%   I personally performed the services described in this documentation, which was scribed in my presence. The recorded information has been reviewed and is accurate.   Jaynie Crumble, PA-C 11/11/15 1327  Azalia Bilis, MD 11/12/15 [phone]

## 2015-11-11 NOTE — Discharge Instructions (Signed)
Your strep test is negative. Your symptoms are most likely due to upper respiratory infection, possibly influenza. Take ibuprofen for pain. Take tramadol for severe pain. Robitussin-DM for cough. This started water gargles to help and reduce the pain and swelling. Follow-up with primary care doctor for recheck in 2-3 days if not improving.   Upper Respiratory Infection, Adult Most upper respiratory infections (URIs) are a viral infection of the air passages leading to the lungs. A URI affects the nose, throat, and upper air passages. The most common type of URI is nasopharyngitis and is typically referred to as "the common cold." URIs run their course and usually go away on their own. Most of the time, a URI does not require medical attention, but sometimes a bacterial infection in the upper airways can follow a viral infection. This is called a secondary infection. Sinus and middle ear infections are common types of secondary upper respiratory infections. Bacterial pneumonia can also complicate a URI. A URI can worsen asthma and chronic obstructive pulmonary disease (COPD). Sometimes, these complications can require emergency medical care and may be life threatening.  CAUSES Almost all URIs are caused by viruses. A virus is a type of germ and can spread from one person to another.  RISKS FACTORS You may be at risk for a URI if:   You smoke.   You have chronic heart or lung disease.  You have a weakened defense (immune) system.   You are very young or very old.   You have nasal allergies or asthma.  You work in crowded or poorly ventilated areas.  You work in health care facilities or schools. SIGNS AND SYMPTOMS  Symptoms typically develop 2-3 days after you come in contact with a cold virus. Most viral URIs last 7-10 days. However, viral URIs from the influenza virus (flu virus) can last 14-18 days and are typically more severe. Symptoms may include:   Runny or stuffy (congested)  nose.   Sneezing.   Cough.   Sore throat.   Headache.   Fatigue.   Fever.   Loss of appetite.   Pain in your forehead, behind your eyes, and over your cheekbones (sinus pain).  Muscle aches.  DIAGNOSIS  Your health care provider may diagnose a URI by:  Physical exam.  Tests to check that your symptoms are not due to another condition such as:  Strep throat.  Sinusitis.  Pneumonia.  Asthma. TREATMENT  A URI goes away on its own with time. It cannot be cured with medicines, but medicines may be prescribed or recommended to relieve symptoms. Medicines may help:  Reduce your fever.  Reduce your cough.  Relieve nasal congestion. HOME CARE INSTRUCTIONS   Take medicines only as directed by your health care provider.   Gargle warm saltwater or take cough drops to comfort your throat as directed by your health care provider.  Use a warm mist humidifier or inhale steam from a shower to increase air moisture. This may make it easier to breathe.  Drink enough fluid to keep your urine clear or pale yellow.   Eat soups and other clear broths and maintain good nutrition.   Rest as needed.   Return to work when your temperature has returned to normal or as your health care provider advises. You may need to stay home longer to avoid infecting others. You can also use a face mask and careful hand washing to prevent spread of the virus.  Increase the usage of your inhaler if  you have asthma.   Do not use any tobacco products, including cigarettes, chewing tobacco, or electronic cigarettes. If you need help quitting, ask your health care provider. PREVENTION  The best way to protect yourself from getting a cold is to practice good hygiene.   Avoid oral or hand contact with people with cold symptoms.   Wash your hands often if contact occurs.  There is no clear evidence that vitamin C, vitamin E, echinacea, or exercise reduces the chance of developing a  cold. However, it is always recommended to get plenty of rest, exercise, and practice good nutrition.  SEEK MEDICAL CARE IF:   You are getting worse rather than better.   Your symptoms are not controlled by medicine.   You have chills.  You have worsening shortness of breath.  You have brown or red mucus.  You have yellow or brown nasal discharge.  You have pain in your face, especially when you bend forward.  You have a fever.  You have swollen neck glands.  You have pain while swallowing.  You have white areas in the back of your throat. SEEK IMMEDIATE MEDICAL CARE IF:   You have severe or persistent:  Headache.  Ear pain.  Sinus pain.  Chest pain.  You have chronic lung disease and any of the following:  Wheezing.  Prolonged cough.  Coughing up blood.  A change in your usual mucus.  You have a stiff neck.  You have changes in your:  Vision.  Hearing.  Thinking.  Mood. MAKE SURE YOU:   Understand these instructions.  Will watch your condition.  Will get help right away if you are not doing well or get worse.   This information is not intended to replace advice given to you by your hePharyngitis Pharyngitis is redness, pain, and swelling (inflammation) of your pharynx.  CAUSES  Pharyngitis is usually caused by infection. Most of the time, these infections are from viruses (viral) and are part of a cold. However, sometimes pharyngitis is caused by bacteria (bacterial). Pharyngitis can also be caused by allergies. Viral pharyngitis may be spread from person to person by coughing, sneezing, and personal items or utensils (cups, forks, spoons, toothbrushes). Bacterial pharyngitis may be spread from person to person by more intimate contact, such as kissing.  SIGNS AND SYMPTOMS  Symptoms of pharyngitis include:   Sore throat.   Tiredness (fatigue).   Low-grade fever.   Headache.  Joint pain and muscle aches.  Skin rashes.  Swollen  lymph nodes.  Plaque-like film on throat or tonsils (often seen with bacterial pharyngitis). DIAGNOSIS  Your health care provider will ask you questions about your illness and your symptoms. Your medical history, along with a physical exam, is often all that is needed to diagnose pharyngitis. Sometimes, a rapid strep test is done. Other lab tests may also be done, depending on the suspected cause.  TREATMENT  Viral pharyngitis will usually get better in 3-4 days without the use of medicine. Bacterial pharyngitis is treated with medicines that kill germs (antibiotics).  HOME CARE INSTRUCTIONS   Drink enough water and fluids to keep your urine clear or pale yellow.   Only take over-the-counter or prescription medicines as directed by your health care provider:   If you are prescribed antibiotics, make sure you finish them even if you start to feel better.   Do not take aspirin.   Get lots of rest.   Gargle with 8 oz of salt water ( tsp of  salt per 1 qt of water) as often as every 1-2 hours to soothe your throat.   Throat lozenges (if you are not at risk for choking) or sprays may be used to soothe your throat. SEEK MEDICAL CARE IF:   You have large, tender lumps in your neck.  You have a rash.  You cough up green, yellow-brown, or bloody spit. SEEK IMMEDIATE MEDICAL CARE IF:   Your neck becomes stiff.  You drool or are unable to swallow liquids.  You vomit or are unable to keep medicines or liquids down.  You have severe pain that does not go away with the use of recommended medicines.  You have trouble breathing (not caused by a stuffy nose). MAKE SURE YOU:   Understand these instructions.  Will watch your condition.  Will get help right away if you are not doing well or get worse.   This information is not intended to replace advice given to you by your health care provider. Make sure you discuss any questions you have with your health care provider.     Document Released: 07/24/2005 Document Revised: 05/14/2013 Document Reviewed: 03/31/2013 Elsevier Interactive Patient Education 2016 [email or web address]. alth care provider. Make sure you discuss any questions you have with your health care provider.   Document Released: 01/17/2001 Document Revised: 12/08/2014 Document Reviewed: 10/29/2013 Elsevier Interactive Patient Education [employer]

## 2015-11-11 NOTE — ED Notes (Signed)
Per pt, states cold symptoms, body aches since yesterday

## 2015-11-11 NOTE — ED Notes (Signed)
Pt reports throat "pressure" , headache , earcahe , neck pain yet sts don't think neck pain is muscular pain  And it is due to the pressure at the throat. denies congestion . Yet sts light sensitivity

## 2015-11-15 LAB — CULTURE, GROUP A STREP (THRC)

## 2023-06-07 ENCOUNTER — Emergency Department (HOSPITAL_COMMUNITY)
Admission: EM | Admit: 2023-06-07 | Discharge: 2023-06-07 | Disposition: A | Discharge status: Home / Self Care | Payer: No Typology Code available for payment source | Attending: Emergency Medicine | Admitting: Emergency Medicine

## 2023-06-07 ENCOUNTER — Emergency Department (HOSPITAL_COMMUNITY): Payer: No Typology Code available for payment source

## 2023-06-07 ENCOUNTER — Encounter (HOSPITAL_COMMUNITY): Payer: Self-pay

## 2023-06-07 DIAGNOSIS — R1030 Lower abdominal pain, unspecified: Secondary | ICD-10-CM | POA: Diagnosis present

## 2023-06-07 DIAGNOSIS — Z91040 Latex allergy status: Secondary | ICD-10-CM | POA: Insufficient documentation

## 2023-06-07 DIAGNOSIS — R102 Pelvic and perineal pain: Secondary | ICD-10-CM

## 2023-06-07 DIAGNOSIS — M545 Low back pain, unspecified: Secondary | ICD-10-CM | POA: Insufficient documentation

## 2023-06-07 DIAGNOSIS — N83201 Unspecified ovarian cyst, right side: Secondary | ICD-10-CM | POA: Diagnosis not present

## 2023-06-07 LAB — COMPREHENSIVE METABOLIC PANEL
ALT: 14 U/L (ref 0–44)
AST: 15 U/L (ref 15–41)
Albumin: 4.3 g/dL (ref 3.5–5.0)
Alkaline Phosphatase: 63 U/L (ref 38–126)
Anion gap: 9 (ref 5–15)
BUN: 12 mg/dL (ref 6–20)
CO2: 26 mmol/L (ref 22–32)
Calcium: 9.3 mg/dL (ref 8.9–10.3)
Chloride: 103 mmol/L (ref 98–111)
Creatinine, Ser: 0.45 mg/dL (ref 0.44–1.00)
GFR, Estimated: >60 mL/min (ref >60)
Glucose, Bld: 96 mg/dL (ref 70–99)
Potassium: 3.7 mmol/L (ref 3.5–5.1)
Sodium: 138 mmol/L (ref 135–145)
Total Bilirubin: 0.7 mg/dL (ref 0.3–1.2)
Total Protein: 8.2 g/dL — ABNORMAL HIGH (ref 6.5–8.1)

## 2023-06-07 LAB — CBC WITH DIFFERENTIAL/PLATELET
Abs Immature Granulocytes: 0 10*3/uL (ref 0.00–0.07)
Basophils Absolute: 0 10*3/uL (ref 0.0–0.1)
Basophils Relative: 0 %
Eosinophils Absolute: 0 10*3/uL (ref 0.0–0.5)
Eosinophils Relative: 1 %
HCT: 40.2 % (ref 36.0–46.0)
Hemoglobin: 13.3 g/dL (ref 12.0–15.0)
Immature Granulocytes: 0 %
Lymphocytes Relative: 56 %
Lymphs Abs: 1.8 10*3/uL (ref 0.7–4.0)
MCH: 28 pg (ref 26.0–34.0)
MCHC: 33.1 g/dL (ref 30.0–36.0)
MCV: 84.6 fL (ref 80.0–100.0)
Monocytes Absolute: 0.3 10*3/uL (ref 0.1–1.0)
Monocytes Relative: 8 %
Neutro Abs: 1.1 10*3/uL — ABNORMAL LOW (ref 1.7–7.7)
Neutrophils Relative %: 35 %
Platelets: 266 10*3/uL (ref 150–400)
RBC: 4.75 MIL/uL (ref 3.87–5.11)
RDW: 13.6 % (ref 11.5–15.5)
WBC: 3.2 10*3/uL — ABNORMAL LOW (ref 4.0–10.5)
nRBC: 0 % (ref 0.0–0.2)

## 2023-06-07 LAB — LIPASE, BLOOD: Lipase: 39 U/L (ref 11–51)

## 2023-06-07 MED ORDER — KETOROLAC TROMETHAMINE 30 MG/ML IJ SOLN
30.0000 mg | Freq: Once | INTRAMUSCULAR | Status: AC
  Administered 2023-06-07: 30 mg via INTRAVENOUS
  Filled 2023-06-07: qty 1

## 2023-06-07 MED ORDER — ONDANSETRON HCL 4 MG/2ML IJ SOLN
4.0000 mg | Freq: Once | INTRAMUSCULAR | Status: AC
  Administered 2023-06-07: 4 mg via INTRAVENOUS
  Filled 2023-06-07: qty 2

## 2023-06-07 MED ORDER — IOHEXOL 300 MG/ML  SOLN
100.0000 mL | Freq: Once | INTRAMUSCULAR | Status: AC | PRN
  Administered 2023-06-07: 100 mL via INTRAVENOUS

## 2023-06-07 MED ORDER — LORAZEPAM 2 MG/ML IJ SOLN
1.0000 mg | Freq: Once | INTRAMUSCULAR | Status: AC
  Administered 2023-06-07: 1 mg via INTRAVENOUS
  Filled 2023-06-07: qty 1

## 2023-06-07 NOTE — ED Notes (Signed)
Patient transported to CT 

## 2023-06-07 NOTE — ED Triage Notes (Signed)
C/o RUQ pain that worsens after eating. Pt doctor told her they think her gallbladder is causing pain.

## 2023-06-07 NOTE — ED Provider Notes (Signed)
[place] EMERGENCY DEPARTMENT AT [facility] Provider Note   CSN: [id number] Arrival date & time: 06/07/23  1218     History  Chief Complaint  Patient presents with   Abdominal Pain    Monique Acosta is a 47 y.o. female.  HPI Patient reports she has been having right lower abdominal pain since February.  She reports it is intensely stabbing intermittently and of more dull constant pain fairly regularly.  Patient previously had uterine hysterectomy for fibroids.  Ovaries are still in place.  Patient denies she had pain or burning with urination.  She reports she does try to stay well-hydrated.  She reports sometimes her urine is somewhat cloudy but she drinks a lot of water.  Fever no chills.  Sometimes she has low back pain but not consistently with this.  Occasionally when the pain is severe she feels nauseated.  No vomiting or diarrhea.  Patient reports that she is due to get a HIDA scan in the beginning of November but, during my evaluation she indicates that her pain is typically in the very lower right quadrant and she does not indicate that the upper mid abdomen.    Home Medications Prior to Admission medications   Medication Sig Start Date End Date Taking? Authorizing Provider  acetaminophen (TYLENOL) 500 MG tablet Take 500 mg by mouth 2 (two) times daily as needed for mild pain (pain score 1-3) or fever.   Yes [provider]  albuterol (PROVENTIL HFA;VENTOLIN HFA) 108 (90 BASE) MCG/ACT inhaler Inhale 2 puffs into the lungs every 6 (six) hours as needed for wheezing or shortness of breath.   Yes [provider]  ascorbic acid (VITAMIN C) 500 MG tablet Take 500 mg by mouth daily.   Yes [provider]  Aspirin-Salicylamide-Caffeine (BC HEADACHE POWDER PO) Take 1 packet by mouth daily as needed (for a severe headache or migraine).   Yes [provider]  budesonide-formoterol (SYMBICORT) 80-4.5 MCG/ACT inhaler Inhale 2 puffs into the  lungs 2 (two) times daily as needed (for shortness of breath).   Yes [provider]  Carboxymethylcellulose Sod PF 0.5 % SOLN Place 1 drop into both eyes 2 (two) times daily as needed (for dryness). 06/20/22  Yes [provider]  cetirizine (ZYRTEC) 10 MG tablet Take 10 mg by mouth at bedtime.   Yes [provider]  EPINEPHrine 0.3 mg/0.3 mL IJ SOAJ injection Inject 0.3 mg into the muscle as needed for anaphylaxis.   Yes [provider]  ergocalciferol (VITAMIN D2) 1.25 MG (50000 UT) capsule Take 50,000 Units by mouth every Monday.   Yes [provider]  ibuprofen (ADVIL) 800 MG tablet Take 800 mg by mouth every 8 (eight) hours as needed for mild pain (pain score 1-3), moderate pain (pain score 4-6) or headache.   Yes [provider]  lidocaine (LIDODERM) 5 % Place 1 patch onto the skin daily as needed (for pain). Remove & Discard patch within 12 hours or as directed by MD   Yes [provider]  lidocaine (XYLOCAINE) 5 % ointment Apply 1 Application topically 2 (two) times daily as needed (for pain).   Yes [provider]  lubiprostone (AMITIZA) 24 MCG capsule Take 24 mcg by mouth 2 (two) times daily with a meal.   Yes [provider]  montelukast (SINGULAIR) 10 MG tablet Take 10 mg by mouth daily.   Yes [provider]  olopatadine (PATANOL) 0.1 % ophthalmic solution Place 1 drop into  both eyes 2 (two) times daily as needed for allergies.   Yes [provider]  sodium chloride (OCEAN) 0.65 % nasal spray Place 1 spray into the nose as needed for congestion. 08/19/13  Yes [provider]  fluconazole (DIFLUCAN) 150 MG tablet 1 tablet every 72 hours until completed Patient not taking: Reported on 06/07/2023 04/10/15   Muthersbaugh, Dahlia Client, PA-C  guaiFENesin-dextromethorphan (ROBITUSSIN DM) 100-10 MG/5ML syrup Take 5 mLs by mouth every 4 (four) hours as needed for cough. Patient not taking: Reported  on 06/07/2023 11/11/15   Jaynie Crumble, PA-C  ibuprofen (ADVIL,MOTRIN) 600 MG tablet Take 1 tablet (600 mg total) by mouth every 6 (six) hours as needed. Patient not taking: Reported on 06/07/2023 11/11/15   Jaynie Crumble, PA-C  levofloxacin (LEVAQUIN) 750 MG tablet Take 1 tablet (750 mg total) by mouth daily. X 7 days Patient not taking: Reported on 06/07/2023 10/07/14   Fayrene Helper, PA-C  ondansetron (ZOFRAN) 4 MG tablet Take 1 tablet (4 mg total) by mouth every 8 (eight) hours as needed for nausea or vomiting. Patient not taking: Reported on 06/07/2023 10/07/14   Fayrene Helper, PA-C  oxyCODONE-acetaminophen (PERCOCET/ROXICET) 5-325 MG per tablet Take 1 tablet by mouth every 4 (four) hours as needed for severe pain. Patient not taking: Reported on 06/07/2023 01/31/14   Margarita Grizzle, MD  predniSONE (DELTASONE) 20 MG tablet Take 60 mg daily x 2 days then 40 mg daily x 2 days then 20 mg daily x 2 days Patient not taking: Reported on 10/07/2014 06/27/14   Charlynne Pander, MD  traMADol (ULTRAM) 50 MG tablet Take 1 tablet (50 mg total) by mouth every 6 (six) hours as needed. Patient not taking: Reported on 06/07/2023 11/11/15   Jaynie Crumble, PA-C      Allergies    Amoxicillin-pot clavulanate, Azithromycin, Bee venom, Cephalexin, Covid-19 mrna vacc (moderna), Doxycycline, Iron sucrose, Metronidazole, Nitrofurantoin, Nitrofurantoin macrocrystal, Other, Peach [prunus persica], Penicillins, Sulfa antibiotics, Sumatriptan, Tomato, Vancomycin, Sertraline hcl, Amitriptyline, Ciprofloxacin, Darunavir, Erythromycin, Gabapentin, Iron, Nortriptyline, Quetiapine, Sertraline, Tape, Tramadol, Zonisamide, Clindamycin, Clindamycin/lincomycin, Latex, Lidocaine, and Sulfamethoxazole-trimethoprim    Review of Systems   Review of Systems  Physical Exam Updated Vital Signs BP (!) 162/100   Pulse 62   Temp 98.4 F (36.9 C) (Oral)   Resp 18   SpO2 100%  Physical Exam Constitutional:      Comments:  Patient is alert.  No respiratory distress.  Well-nourished well-developed.  She is anxious.  HENT:     Mouth/Throat:     Mouth: Mucous membranes are moist.     Pharynx: Oropharynx is clear.  Eyes:     Extraocular Movements: Extraocular movements intact.  Cardiovascular:     Rate and Rhythm: Normal rate and regular rhythm.  Pulmonary:     Effort: Pulmonary effort is normal.     Breath sounds: Normal breath sounds.  Abdominal:     Comments: Abdomen soft.  Moderate reproducible right lower quadrant pain low in the pelvis.  No significant reproducible epigastric pain or right upper quadrant pain.  Patient does endorse some tenderness to percussion over the right CVA area.  Musculoskeletal:        General: No swelling or tenderness. Normal range of motion.     Right lower leg: No edema.     Left lower leg: No edema.  Skin:    General: Skin is warm and dry.  Neurological:     General: No focal deficit present.     Mental Status: She  is oriented to person, place, and time.     Coordination: Coordination normal.  Psychiatric:     Comments: Patient was initially extremely anxious.  She was tearful and to the point of hyperventilating.  Patient reports that this happens when she is in the emergency department in the hospital.     ED Results / Procedures / Treatments   Labs (all labs ordered are listed, but only abnormal results are displayed) Labs Reviewed  COMPREHENSIVE METABOLIC PANEL - Abnormal; Notable for the following components:      Result Value   Total Protein 8.2 (*)    All other components within normal limits  CBC WITH DIFFERENTIAL/PLATELET - Abnormal; Notable for the following components:   WBC 3.2 (*)    Neutro Abs 1.1 (*)    All other components within normal limits  LIPASE, BLOOD  URINALYSIS, ROUTINE W REFLEX MICROSCOPIC    EKG None  Radiology US Pelvis Complete  Result Date: 06/07/2023 CLINICAL DATA:  Right lower quadrant pain.  Cyst seen on CT. EXAM:  TRANSABDOMINAL AND TRANSVAGINAL ULTRASOUND OF PELVIS DOPPLER ULTRASOUND OF OVARIES TECHNIQUE: Both transabdominal and transvaginal ultrasound examinations of the pelvis were performed. Transabdominal technique was performed for global imaging of the pelvis including uterus, ovaries, adnexal regions, and pelvic cul-de-sac. It was necessary to proceed with endovaginal exam following the transabdominal exam to visualize the endometrium and ovaries. Color and duplex Doppler ultrasound was utilized to evaluate blood flow to the ovaries. COMPARISON:  CT abdomen pelvis dated 06/07/2023. FINDINGS: Uterus Hysterectomy. Endometrium Hysterectomy. Right ovary Measurements: 4.4 x 2.8 x 4.0 cm = volume: 26 mL. There is a 3.5 x 3.0 x 3.1 cm simple appearing cyst in the right ovary. Doppler images demonstrate arterial and venous flow to the ovarian parenchyma. Left ovary Not visualized. Other findings No abnormal free fluid. IMPRESSION: 1. Right ovarian cyst. No follow up imaging recommended. Note: This recommendation does not apply to premenarchal patients or to those with increased risk (genetic, family history, elevated tumor markers or other high-risk factors) of ovarian cancer. Reference: Radiology 2019 Nov; 293(2):359-371. 2. Hysterectomy. Electronically Signed   By: Elgie Collard M.D.   On: 06/07/2023 19:15   US Transvaginal Non-OB  Result Date: 06/07/2023 CLINICAL DATA:  Right lower quadrant pain.  Cyst seen on CT. EXAM: TRANSABDOMINAL AND TRANSVAGINAL ULTRASOUND OF PELVIS DOPPLER ULTRASOUND OF OVARIES TECHNIQUE: Both transabdominal and transvaginal ultrasound examinations of the pelvis were performed. Transabdominal technique was performed for global imaging of the pelvis including uterus, ovaries, adnexal regions, and pelvic cul-de-sac. It was necessary to proceed with endovaginal exam following the transabdominal exam to visualize the endometrium and ovaries. Color and duplex Doppler ultrasound was utilized to  evaluate blood flow to the ovaries. COMPARISON:  CT abdomen pelvis dated 06/07/2023. FINDINGS: Uterus Hysterectomy. Endometrium Hysterectomy. Right ovary Measurements: 4.4 x 2.8 x 4.0 cm = volume: 26 mL. There is a 3.5 x 3.0 x 3.1 cm simple appearing cyst in the right ovary. Doppler images demonstrate arterial and venous flow to the ovarian parenchyma. Left ovary Not visualized. Other findings No abnormal free fluid. IMPRESSION: 1. Right ovarian cyst. No follow up imaging recommended. Note: This recommendation does not apply to premenarchal patients or to those with increased risk (genetic, family history, elevated tumor markers or other high-risk factors) of ovarian cancer. Reference: Radiology 2019 Nov; 293(2):359-371. 2. Hysterectomy. Electronically Signed   By: Elgie Collard M.D.   On: 06/07/2023 19:15   [place] Art/Ven Flow Abd Pelv Doppler  Result Date:  06/07/2023 CLINICAL DATA:  Right lower quadrant pain.  Cyst seen on CT. EXAM: TRANSABDOMINAL AND TRANSVAGINAL ULTRASOUND OF PELVIS DOPPLER ULTRASOUND OF OVARIES TECHNIQUE: Both transabdominal and transvaginal ultrasound examinations of the pelvis were performed. Transabdominal technique was performed for global imaging of the pelvis including uterus, ovaries, adnexal regions, and pelvic cul-de-sac. It was necessary to proceed with endovaginal exam following the transabdominal exam to visualize the endometrium and ovaries. Color and duplex Doppler ultrasound was utilized to evaluate blood flow to the ovaries. COMPARISON:  CT abdomen pelvis dated 06/07/2023. FINDINGS: Uterus Hysterectomy. Endometrium Hysterectomy. Right ovary Measurements: 4.4 x 2.8 x 4.0 cm = volume: 26 mL. There is a 3.5 x 3.0 x 3.1 cm simple appearing cyst in the right ovary. Doppler images demonstrate arterial and venous flow to the ovarian parenchyma. Left ovary Not visualized. Other findings No abnormal free fluid. IMPRESSION: 1. Right ovarian cyst. No follow up imaging recommended.  Note: This recommendation does not apply to premenarchal patients or to those with increased risk (genetic, family history, elevated tumor markers or other high-risk factors) of ovarian cancer. Reference: Radiology 2019 Nov; 293(2):359-371. 2. Hysterectomy. Electronically Signed   By: Elgie Collard M.D.   On: 06/07/2023 19:15   CT ABDOMEN PELVIS W CONTRAST  Result Date: 06/07/2023 CLINICAL DATA:  Right upper quadrant pain.  Nausea and vomiting. EXAM: CT ABDOMEN AND PELVIS WITH CONTRAST TECHNIQUE: Multidetector CT imaging of the abdomen and pelvis was performed using the standard protocol following bolus administration of intravenous contrast. RADIATION DOSE REDUCTION: This exam was performed according to the departmental dose-optimization program which includes automated exposure control, adjustment of the mA and/or kV according to patient size and/or use of iterative reconstruction technique. CONTRAST:  OMNIPAQUE IOHEXOL 300 MG/ML  SOLN COMPARISON:  01/21/2008 FINDINGS: Lower Chest: No acute findings. Hepatobiliary: No suspicious hepatic masses identified. Several hepatic cysts are noted. Gallbladder is unremarkable. No evidence of biliary ductal dilatation. Pancreas:  No mass or inflammatory changes. Spleen: Within normal limits in size and appearance. Adrenals/Urinary Tract: No suspicious masses identified. No evidence of ureteral calculi or hydronephrosis. Stomach/Bowel: Small hiatal hernia noted. No evidence of obstruction, inflammatory process or abnormal fluid collections. Normal appendix visualized. Vascular/Lymphatic: No pathologically enlarged lymph nodes. No acute vascular findings. Reproductive: Prior hysterectomy simple appearing cyst in the right adnexa measuring 3.4 cm. No evidence of inflammatory process or free fluid. Other:  None. Musculoskeletal:  No suspicious bone lesions identified. IMPRESSION: 3.4 cm simple appearing right adnexal cyst, most likely physiologic in a reproductive  age female. No follow-up imaging recommended. Note: This recommendation does not apply to premenarchal patients and to those with increased risk (genetic, family history, elevated tumor markers or other high-risk factors) of ovarian cancer. Reference: JACR 2020 Feb; 17(2):248-254 Small hiatal hernia. Electronically Signed   By: Danae Orleans M.D.   On: 06/07/2023 16:43    Procedures Procedures    Medications Ordered in ED Medications  ondansetron (ZOFRAN) injection 4 mg (4 mg Intravenous Given 06/07/23 1356)  iohexol (OMNIPAQUE) 300 MG/ML solution 100 mL (100 mLs Intravenous Contrast Given 06/07/23 1442)  ketorolac (TORADOL) 30 MG/ML injection 30 mg (30 mg Intravenous Given 06/07/23 1722)  LORazepam (ATIVAN) injection 1 mg (1 mg Intravenous Given 06/07/23 1720)    ED Course/ Medical Decision Making/ A&P                                 Medical Decision Making Amount and/or  Complexity of Data Reviewed Labs: ordered. Radiology: ordered.  Risk Prescription drug management.  Patient presents as outlined.  She reports she has had fairly persistent right lower quadrant pain that at this point is greater than 6 months.  She reports she has a HIDA scan coming up at the beginning of November, within less than a week.  However as I interview her she very distinctly indicates her pain is deep in the right lower quadrant more in the pelvic area.  Her CT scan showed a cyst in the area.  Patient is already had a hysterectomy but does have her ovaries.  Will proceed with a pelvic ultrasound as patient is 44 and some concern for any cystic or atypical findings at her ovary could be concerning.  Patient was extremely anxious upon initial evaluation.  Higinio Roger this to a prior medical experience during COVID where she was apparently lost and/or forgotten in an emergency department in [place] for more than a day and describes a very challenging experience.  Because of this, she reports that she becomes  extremely anxious in a medical setting.  She was hyperventilating and crying.  At that point I did determine it was better to treat with Ativan and control anxiety and pain.  Patient's blood pressures were elevated as high as 180s over 114 documented.  Patient denies any history of hypertension but then reports she only gets her blood pressure checked when she is in a stressful situation in a medical setting.  We discussed this extensively and the importance of monitoring blood pressure.  Patient will be buying a blood pressure cuff and monitoring this home and a calm circumstance.  Prior to discharge with the patient calm and completely evaluated and pain control, her blood pressures were down to 140s over 90s.  Extensive information for hypertension and home monitoring as well as follow-up included in her discharge instructions.  Pelvic ultrasound confirms cyst structure that by radiology does not appear suggestive for malignancy or emergent condition.  I have counseled the patient to follow-up with her gynecologist as this seems to correlate with her pain.  Also explained that is atypical for ovarian cysts to cause long-term chronic pain and for this reason it is also very important that she get the follow-up and evaluation.  Patient voices understanding.  At Discharge, she is calm and comfortable and in no distress.  Stable for continued outpatient management.         Final Clinical Impression(s) / ED Diagnoses Final diagnoses:  Pelvic pain in female  Cyst of right ovary    Rx / DC Orders ED Discharge Orders     None         Arby Barrette, MD 06/07/23 1941

## 2023-06-07 NOTE — Discharge Instructions (Signed)
1.  You have had a CT scan and ultrasound done in the emergency department.  These tests showed an ovarian cyst on the right.  This seems to be the area of your pain.  Normally, ovarian cyst might cause pain for a few days or less in a week.  It is not typical for them to cause pain for months.  Because you have had this ongoing pain, you need to follow-up with your gynecologist for recheck. 2.  Ovarian cyst are usually treated with anti-inflammatory medications.  You may continue to take extra strength Tylenol and over-the-counter ibuprofen as you have been at home for pain control. 3.  When you were in a lot of pain, your blood pressure was elevated in the emergency department.  This did improve when you are more comfortable.  It is very important for you to monitor your blood pressure at home.  Get a blood pressure machine and keep a journal of how your blood pressures are trending at home in a calm situation.  Reviewed these results with your doctor to determine if you need specific treatment for high blood pressure.  Included in your discharge instructions is educational material about hypertension.

## 2023-06-07 NOTE — ED Provider Triage Note (Addendum)
Emergency Medicine Provider Triage Evaluation Note  Monique Acosta , a 47 y.o. female  was evaluated in triage.  Pt complains of lower abdominal pain.  Review of Systems  Positive:  Negative:   Physical Exam  BP (!) 183/114 (BP Location: Left Arm)   Pulse 86   Temp 98.1 F (36.7 C) (Oral)   Resp 20   SpO2 98%  Gen:   Awake, no distress   Resp:  Normal effort  MSK:   Moves extremities without difficulty  Other:    Medical Decision Making  Medically screening exam initiated at 1:25 PM.  Appropriate orders placed.  [name] was informed that the remainder of the evaluation will be completed by another provider, this initial triage assessment does not replace that evaluation, and the importance of remaining in the ED until their evaluation is complete.  Lower abdominal pain that is worse after eating. Worse after eating food. Has been to ED twice since this has been happening intermittently since February. Imaging has been concerning for gallbladder sludge. Patient with follow up appointment for this 06/15/2023.  Patient with new symptom of sweaty hands. Feels like something is stabbing her under shoulder blades and back. States that she drinks 4-5 gallons of water monthly and not sure why these symptoms are happening.   Stating that she would usually just take a bunch of advil and tylenol, but was told by her boss that she needs to come to ED.   Denies fever, vomiting, diarrhea, dysuria, hematuria, hematochezia. Hx of hysterectomy.     Monique Acosta F, [place] 06/07/23 1330

## 2023-09-25 DIAGNOSIS — Z91040 Latex allergy status: Secondary | ICD-10-CM | POA: Insufficient documentation

## 2023-09-25 DIAGNOSIS — R490 Dysphonia: Secondary | ICD-10-CM | POA: Diagnosis not present

## 2023-09-25 DIAGNOSIS — J40 Bronchitis, not specified as acute or chronic: Secondary | ICD-10-CM | POA: Diagnosis present

## 2023-09-25 DIAGNOSIS — Z79899 Other long term (current) drug therapy: Secondary | ICD-10-CM | POA: Insufficient documentation

## 2023-09-26 ENCOUNTER — Other Ambulatory Visit: Payer: Self-pay

## 2023-09-26 ENCOUNTER — Emergency Department (HOSPITAL_COMMUNITY): Payer: No Typology Code available for payment source

## 2023-09-26 ENCOUNTER — Emergency Department (HOSPITAL_COMMUNITY)
Admission: EM | Admit: 2023-09-26 | Discharge: 2023-09-26 | Disposition: A | Discharge status: Home / Self Care | Payer: No Typology Code available for payment source | Attending: Emergency Medicine | Admitting: Emergency Medicine

## 2023-09-26 DIAGNOSIS — J40 Bronchitis, not specified as acute or chronic: Secondary | ICD-10-CM

## 2023-09-26 DIAGNOSIS — R490 Dysphonia: Secondary | ICD-10-CM

## 2023-09-26 LAB — RESP PANEL BY RT-PCR (RSV, FLU A&B, COVID)  RVPGX2
Influenza A by PCR: NEGATIVE
Influenza B by PCR: NEGATIVE
Resp Syncytial Virus by PCR: NEGATIVE
SARS Coronavirus 2 by RT PCR: NEGATIVE

## 2023-09-26 LAB — CBC WITH DIFFERENTIAL/PLATELET
Abs Immature Granulocytes: 0.02 10*3/uL (ref 0.00–0.07)
Basophils Absolute: 0 10*3/uL (ref 0.0–0.1)
Basophils Relative: 0 %
Eosinophils Absolute: 0 10*3/uL (ref 0.0–0.5)
Eosinophils Relative: 1 %
HCT: 35.9 % — ABNORMAL LOW (ref 36.0–46.0)
Hemoglobin: 12.1 g/dL (ref 12.0–15.0)
Immature Granulocytes: 0 %
Lymphocytes Relative: 38 %
Lymphs Abs: 2.3 10*3/uL (ref 0.7–4.0)
MCH: 28 pg (ref 26.0–34.0)
MCHC: 33.7 g/dL (ref 30.0–36.0)
MCV: 83.1 fL (ref 80.0–100.0)
Monocytes Absolute: 0.5 10*3/uL (ref 0.1–1.0)
Monocytes Relative: 8 %
Neutro Abs: 3.1 10*3/uL (ref 1.7–7.7)
Neutrophils Relative %: 53 %
Platelets: 212 10*3/uL (ref 150–400)
RBC: 4.32 MIL/uL (ref 3.87–5.11)
RDW: 13.2 % (ref 11.5–15.5)
WBC: 5.9 10*3/uL (ref 4.0–10.5)
nRBC: 0 % (ref 0.0–0.2)

## 2023-09-26 LAB — COMPREHENSIVE METABOLIC PANEL
ALT: 25 U/L (ref 0–44)
AST: 28 U/L (ref 15–41)
Albumin: 3.5 g/dL (ref 3.5–5.0)
Alkaline Phosphatase: 51 U/L (ref 38–126)
Anion gap: 9 (ref 5–15)
BUN: 9 mg/dL (ref 6–20)
CO2: 27 mmol/L (ref 22–32)
Calcium: 8.8 mg/dL — ABNORMAL LOW (ref 8.9–10.3)
Chloride: 104 mmol/L (ref 98–111)
Creatinine, Ser: 0.48 mg/dL (ref 0.44–1.00)
GFR, Estimated: >60 mL/min (ref >60)
Glucose, Bld: 102 mg/dL — ABNORMAL HIGH (ref 70–99)
Potassium: 3.4 mmol/L — ABNORMAL LOW (ref 3.5–5.1)
Sodium: 140 mmol/L (ref 135–145)
Total Bilirubin: 0.4 mg/dL (ref 0.0–1.2)
Total Protein: 7.2 g/dL (ref 6.5–8.1)

## 2023-09-26 LAB — LIPASE, BLOOD: Lipase: 36 U/L (ref 11–51)

## 2023-09-26 MED ORDER — ALBUTEROL SULFATE HFA 108 (90 BASE) MCG/ACT IN AERS: 1.0000 | INHALATION_SPRAY | Freq: Four times a day (QID) | RESPIRATORY_TRACT | 0 refills | Status: AC | PRN

## 2023-09-26 MED ORDER — ALUM & MAG HYDROXIDE-SIMETH 200-200-20 MG/5ML PO SUSP
30.0000 mL | Freq: Once | ORAL | Status: AC
  Administered 2023-09-26: 30 mL via ORAL
  Filled 2023-09-26: qty 30

## 2023-09-26 NOTE — ED Provider Notes (Signed)
[place] EMERGENCY DEPARTMENT AT [facility] Provider Note   CSN: [id number] Arrival date & time: 09/25/23  2342     History  Chief Complaint  Patient presents with   Hemoptysis    Patient c/o coughing up blood x 2 times yesterday. [facility] to be seen. Patient had upper endoscopic done recently and her voice comes and goes. Patient is having coughing spells that causes her to become SOB. Patient c/o  throat to chest pain 7/10 but throat pain alone 2/10. Patient hasn't had solid food for the first time today since January 30th.     Monique Acosta is a 48 y.o. female.  HPI     This is a 48 year old female who presents with several complaints.  Patient reports she had an endoscopy on 1/31.  Since that time she has had a raspy voice and significant voice change.  She followed up with her GI doctor and was told that this would not improve.  She did require intubation during this endoscopy.  She states over the last several days she has had increasing coughing.  She believes she coughed up some blood earlier today.  Since her endoscopy she has had ongoing discomfort in the upper abdomen into her throat.  Patient denies any fevers.  Home Medications Prior to Admission medications   Medication Sig Start Date End Date Taking? Authorizing Provider  albuterol (VENTOLIN HFA) 108 (90 Base) MCG/ACT inhaler Inhale 1-2 puffs into the lungs every 6 (six) hours as needed for wheezing or shortness of breath. 09/26/23  Yes Treasure Ingrum, Mayer Masker, MD  acetaminophen (TYLENOL) 500 MG tablet Take 500 mg by mouth 2 (two) times daily as needed for mild pain (pain score 1-3) or fever.    [provider]  albuterol (PROVENTIL HFA;VENTOLIN HFA) 108 (90 BASE) MCG/ACT inhaler Inhale 2 puffs into the lungs every 6 (six) hours as needed for wheezing or shortness of breath.    [provider]  ascorbic acid (VITAMIN C) 500 MG tablet Take 500 mg by mouth daily.     [provider]  Aspirin-Salicylamide-Caffeine (BC HEADACHE POWDER PO) Take 1 packet by mouth daily as needed (for a severe headache or migraine).    [provider]  budesonide-formoterol (SYMBICORT) 80-4.5 MCG/ACT inhaler Inhale 2 puffs into the lungs 2 (two) times daily as needed (for shortness of breath).    [provider]  Carboxymethylcellulose Sod PF 0.5 % SOLN Place 1 drop into both eyes 2 (two) times daily as needed (for dryness). 06/20/22   [provider]  cetirizine (ZYRTEC) 10 MG tablet Take 10 mg by mouth at bedtime.    [provider]  EPINEPHrine 0.3 mg/0.3 mL IJ SOAJ injection Inject 0.3 mg into the muscle as needed for anaphylaxis.    [provider]  ergocalciferol (VITAMIN D2) 1.25 MG (50000 UT) capsule Take 50,000 Units by mouth every Monday.    [provider]  fluconazole (DIFLUCAN) 150 MG tablet 1 tablet every 72 hours until completed Patient not taking: Reported on 06/07/2023 04/10/15   Muthersbaugh, Dahlia Client, PA-C  guaiFENesin-dextromethorphan (ROBITUSSIN DM) 100-10 MG/5ML syrup Take 5 mLs by mouth every 4 (four) hours as needed for cough. Patient not taking: Reported on 06/07/2023 11/11/15   Jaynie Crumble, PA-C  ibuprofen (ADVIL) 800 MG tablet Take 800 mg by mouth every 8 (eight) hours as needed for mild pain (pain score 1-3), moderate pain (pain score 4-6) or headache.  [provider]  ibuprofen (ADVIL,MOTRIN) 600 MG tablet Take 1 tablet (600 mg total) by mouth every 6 (six) hours as needed. Patient not taking: Reported on 06/07/2023 11/11/15   Jaynie Crumble, PA-C  levofloxacin (LEVAQUIN) 750 MG tablet Take 1 tablet (750 mg total) by mouth daily. X 7 days Patient not taking: Reported on 06/07/2023 10/07/14   Fayrene Helper, PA-C  lidocaine (LIDODERM) 5 % Place 1 patch onto the skin daily as needed (for pain). Remove & Discard patch within 12 hours or as directed by MD    [provider]   lidocaine (XYLOCAINE) 5 % ointment Apply 1 Application topically 2 (two) times daily as needed (for pain).    [provider]  lubiprostone (AMITIZA) 24 MCG capsule Take 24 mcg by mouth 2 (two) times daily with a meal.    [provider]  montelukast (SINGULAIR) 10 MG tablet Take 10 mg by mouth daily.    [provider]  olopatadine (PATANOL) 0.1 % ophthalmic solution Place 1 drop into both eyes 2 (two) times daily as needed for allergies.    [provider]  ondansetron (ZOFRAN) 4 MG tablet Take 1 tablet (4 mg total) by mouth every 8 (eight) hours as needed for nausea or vomiting. Patient not taking: Reported on 06/07/2023 10/07/14   Fayrene Helper, PA-C  oxyCODONE-acetaminophen (PERCOCET/ROXICET) 5-325 MG per tablet Take 1 tablet by mouth every 4 (four) hours as needed for severe pain. Patient not taking: Reported on 06/07/2023 01/31/14   Margarita Grizzle, MD  predniSONE (DELTASONE) 20 MG tablet Take 60 mg daily x 2 days then 40 mg daily x 2 days then 20 mg daily x 2 days Patient not taking: Reported on 10/07/2014 06/27/14   Charlynne Pander, MD  sodium chloride (OCEAN) 0.65 % nasal spray Place 1 spray into the nose as needed for congestion. 08/19/13   [provider]  traMADol (ULTRAM) 50 MG tablet Take 1 tablet (50 mg total) by mouth every 6 (six) hours as needed. Patient not taking: Reported on 06/07/2023 11/11/15   Jaynie Crumble, PA-C      Allergies    Amoxicillin-pot clavulanate, Azithromycin, Bee venom, Cephalexin, Covid-19 mrna vacc (moderna), Doxycycline, Iron sucrose, Metronidazole, Nitrofurantoin, Nitrofurantoin macrocrystal, Other, Peach [prunus persica], Penicillins, Sulfa antibiotics, Sumatriptan, Tomato, Vancomycin, Sertraline hcl, Amitriptyline, Ciprofloxacin, Darunavir, Erythromycin, Gabapentin, Iron, Nortriptyline, Quetiapine, Sertraline, Tape, Tramadol, Zonisamide, Clindamycin, Clindamycin/lincomycin, Latex, Lidocaine, and  Sulfamethoxazole-trimethoprim    Review of Systems   Review of Systems  Constitutional:  Negative for fever.  HENT:  Positive for voice change.   Respiratory:  Positive for cough and shortness of breath.   Cardiovascular:  Negative for chest pain.  Gastrointestinal:  Positive for abdominal pain.  All other systems reviewed and are negative.   Physical Exam Updated Vital Signs BP (!) 121/99   Pulse 86   Temp 98.2 F (36.8 C) (Oral)   Resp 10   LMP  (LMP Unknown)   SpO2 94%  Physical Exam Vitals and nursing note reviewed.  Constitutional:      Appearance: She is well-developed. She is not ill-appearing.  HENT:     Head: Normocephalic and atraumatic.     Mouth/Throat:     Comments: Raspy voice, airway intact Eyes:     Pupils: Pupils are equal, round, and reactive to light.  Cardiovascular:     Rate and Rhythm: Normal rate and regular rhythm.     Heart sounds: Normal heart sounds.  Pulmonary:     Effort:  Pulmonary effort is normal. No respiratory distress.     Breath sounds: Wheezing and rhonchi present.     Comments: Occasional wheeze Abdominal:     Palpations: Abdomen is soft.  Musculoskeletal:     Cervical back: Neck supple.  Skin:    General: Skin is warm and dry.  Neurological:     Mental Status: She is alert and oriented to person, place, and time.  Psychiatric:        Mood and Affect: Mood normal.     ED Results / Procedures / Treatments   Labs (all labs ordered are listed, but only abnormal results are displayed) Labs Reviewed  CBC WITH DIFFERENTIAL/PLATELET - Abnormal; Notable for the following components:      Result Value   HCT 35.9 (*)    All other components within normal limits  COMPREHENSIVE METABOLIC PANEL - Abnormal; Notable for the following components:   Potassium 3.4 (*)    Glucose, Bld 102 (*)    Calcium 8.8 (*)    All other components within normal limits  RESP PANEL BY RT-PCR (RSV, FLU A&B, COVID)  RVPGX2  LIPASE, BLOOD     EKG None  Radiology DG Chest Portable 1 View Result Date: 09/26/2023 CLINICAL DATA:  48 year old female with hemoptysis. EXAM: PORTABLE CHEST 1 VIEW COMPARISON:  Portable chest 05/03/2013 and earlier. FINDINGS: Portable AP semi upright view at at 0342 hours. Improved lung volumes. Normal cardiac size and mediastinal contours. Visualized tracheal air column is within normal limits. Allowing for portable technique the lungs are clear. No pneumothorax or pleural effusion. No osseous abnormality identified. Paucity of bowel gas in the visible abdomen. IMPRESSION: Negative portable chest. Electronically Signed   By: Odessa Fleming M.D.   On: 09/26/2023 04:13    Procedures Procedures    Medications Ordered in ED Medications  alum & mag hydroxide-simeth (MAALOX/MYLANTA) 200-200-20 MG/5ML suspension 30 mL (30 mLs Oral Given 09/26/23 0340)    ED Course/ Medical Decision Making/ A&P                                 Medical Decision Making Amount and/or Complexity of Data Reviewed Labs: ordered. Radiology: ordered.  Risk OTC drugs. Prescription drug management.   This patient presents to the ED for concern of cough, shortness of breath, voice change, this involves an extensive number of treatment options, and is a complaint that carries with it a high risk of complications and morbidity.  I considered the following differential and admission for this acute, potentially life threatening condition.  The differential diagnosis includes viral illness such as COVID or influenza, bronchitis, pneumonia, laryngitis, voice change secondary to injury from intubation  MDM:    This is a 48 year old female who presents with both cough that is blood-tinged as well as voice change.  She is nontoxic.  Vital signs are reassuring.  She is afebrile.  She does have a hoarse voice.  Reports this has been since intubation from having an endoscopy over 2 weeks ago.  Clinically she is not in any respiratory distress.   She describes having ongoing reflux symptoms.  EKG shows no evidence of acute ischemia or arrhythmia.  Chest x-ray without pneumothorax or pneumonia.  Patient clinically improved with GI cocktail.  Low suspicion for ACS.  She did have an occasional wheeze on exam.  Will provide with an inhaler for possible bronchitis.  Hemoglobin is stable.  Suggested that she follow-up  with ENT given ongoing voice change related to intubation.  Follow-up with PCP.  (Labs, imaging, consults)  Labs: I Ordered, and personally interpreted labs.  The pertinent results include: CBC, BMP, COVID, influenza  Imaging Studies ordered: I ordered imaging studies including chest x-ray I independently visualized and interpreted imaging. I agree with the radiologist interpretation  Additional history obtained from chart review.  External records from outside source obtained and reviewed including prior evaluations  Cardiac Monitoring: The patient was maintained on a cardiac monitor.  If on the cardiac monitor, I personally viewed and interpreted the cardiac monitored which showed an underlying rhythm of: Sinus  Reevaluation: After the interventions noted above, I reevaluated the patient and found that they have :improved  Social Determinants of Health:  lives independently  Disposition: Discharge  Co morbidities that complicate the patient evaluation  Past Medical History:  Diagnosis Date   Anginal pain (HCC)    "not until today" (01/15/2013)   Anxiety    Chronic lower back pain    Daily headache    Frequent UTI    Peripheral neuropathy    Pneumonia 06/2012   PTSD (post-traumatic stress disorder)    Vitamin D deficiency      Medicines Meds ordered this encounter  Medications   alum & mag hydroxide-simeth (MAALOX/MYLANTA) 200-200-20 MG/5ML suspension 30 mL   albuterol (VENTOLIN HFA) 108 (90 Base) MCG/ACT inhaler    Sig: Inhale 1-2 puffs into the lungs every 6 (six) hours as needed for wheezing or  shortness of breath.    Dispense:  1 each    Refill:  0    I have reviewed the patients home medicines and have made adjustments as needed  Problem List / ED Course: Problem List Items Addressed This Visit   None Visit Diagnoses       Hoarseness of voice    -  Primary     Bronchitis                       Final Clinical Impression(s) / ED Diagnoses Final diagnoses:  Hoarseness of voice  Bronchitis    Rx / DC Orders ED Discharge Orders          Ordered    albuterol (VENTOLIN HFA) 108 (90 Base) MCG/ACT inhaler  Every 6 hours PRN        09/26/23 0543              Shon Baton, MD 09/26/23 [phone]

## 2023-09-26 NOTE — Discharge Instructions (Addendum)
You are seen today for hoarseness of voice and coughing.  You may have some acute bronchitis.  Use an inhaler as needed.  Your x-ray does not show any evidence of pneumonia and your blood work is reassuring.  Regarding your hoarse voice, you should probably follow-up with ENT given that you are intubated for your endoscopy and have not recovered since that time.

## 2023-11-11 ENCOUNTER — Emergency Department (HOSPITAL_COMMUNITY)

## 2023-11-11 ENCOUNTER — Other Ambulatory Visit: Payer: Self-pay

## 2023-11-11 ENCOUNTER — Encounter (HOSPITAL_COMMUNITY): Payer: Self-pay | Admitting: *Deleted

## 2023-11-11 ENCOUNTER — Emergency Department (HOSPITAL_COMMUNITY)
Admission: EM | Admit: 2023-11-11 | Discharge: 2023-11-12 | Disposition: A | Discharge status: Home / Self Care | Attending: Emergency Medicine | Admitting: Emergency Medicine

## 2023-11-11 DIAGNOSIS — S9031XA Contusion of right foot, initial encounter: Secondary | ICD-10-CM | POA: Diagnosis present

## 2023-11-11 DIAGNOSIS — Z91040 Latex allergy status: Secondary | ICD-10-CM | POA: Insufficient documentation

## 2023-11-11 DIAGNOSIS — X509XXA Other and unspecified overexertion or strenuous movements or postures, initial encounter: Secondary | ICD-10-CM | POA: Insufficient documentation

## 2023-11-11 DIAGNOSIS — Y92009 Unspecified place in unspecified non-institutional (private) residence as the place of occurrence of the external cause: Secondary | ICD-10-CM | POA: Diagnosis not present

## 2023-11-11 MED ORDER — OXYCODONE HCL 5 MG PO TABS: 5.0000 mg | ORAL_TABLET | Freq: Four times a day (QID) | ORAL | 0 refills | Status: AC | PRN

## 2023-11-11 MED ORDER — OXYCODONE-ACETAMINOPHEN 5-325 MG PO TABS
1.0000 | ORAL_TABLET | Freq: Once | ORAL | Status: AC
  Administered 2023-11-11: 1 via ORAL
  Filled 2023-11-11: qty 1

## 2023-11-11 NOTE — ED Triage Notes (Signed)
 Pain in her rt foot since Monday when she was excercising on a house machine  lmp none

## 2023-11-11 NOTE — ED Provider Notes (Incomplete)
 [place] EMERGENCY DEPARTMENT AT [facility] Provider Note   CSN: [id number] Arrival date & time: 11/11/23  2016     History {Add pertinent medical, surgical, social history, OB history to HPI:1} Chief Complaint  Patient presents with  . Foot Pain    Monique Acosta is a 48 y.o. female with a history of low back pain and peripheral neuropathy presents the ED today for foot pain.  Patient reports that she was working out at her house a week ago, when she developed pain to the bottom of the right foot after doing cardio.  States that she woke up the next morning with increased pain.  Denies any known injury or trauma to the foot reports pain at the pad of the foot just proximal to the toes that radiates throughout the foot when she applies pressure with ambulation.  She has been taking Tylenol for the pain without improvement.  States that she noticed some swelling of her toes earlier today and was advised to come to the ED for further evaluation.  Denies pain to the ankle or left foot.  No additional complaints or concerns at this time.    Home Medications Prior to Admission medications   Medication Sig Start Date End Date Taking? Authorizing Provider  acetaminophen (TYLENOL) 500 MG tablet Take 500 mg by mouth 2 (two) times daily as needed for mild pain (pain score 1-3) or fever.    [provider]  albuterol (PROVENTIL HFA;VENTOLIN HFA) 108 (90 BASE) MCG/ACT inhaler Inhale 2 puffs into the lungs every 6 (six) hours as needed for wheezing or shortness of breath.    [provider]  albuterol (VENTOLIN HFA) 108 (90 Base) MCG/ACT inhaler Inhale 1-2 puffs into the lungs every 6 (six) hours as needed for wheezing or shortness of breath. 09/26/23   Horton, Mayer Masker, MD  ascorbic acid (VITAMIN C) 500 MG tablet Take 500 mg by mouth daily.    [provider]  Aspirin-Salicylamide-Caffeine (BC HEADACHE POWDER PO) Take 1 packet by mouth daily as needed (for a severe  headache or migraine).    [provider]  budesonide-formoterol (SYMBICORT) 80-4.5 MCG/ACT inhaler Inhale 2 puffs into the lungs 2 (two) times daily as needed (for shortness of breath).    [provider]  Carboxymethylcellulose Sod PF 0.5 % SOLN Place 1 drop into both eyes 2 (two) times daily as needed (for dryness). 06/20/22   [provider]  cetirizine (ZYRTEC) 10 MG tablet Take 10 mg by mouth at bedtime.    [provider]  EPINEPHrine 0.3 mg/0.3 mL IJ SOAJ injection Inject 0.3 mg into the muscle as needed for anaphylaxis.    [provider]  ergocalciferol (VITAMIN D2) 1.25 MG (50000 UT) capsule Take 50,000 Units by mouth every Monday.    [provider]  fluconazole (DIFLUCAN) 150 MG tablet 1 tablet every 72 hours until completed Patient not taking: Reported on 06/07/2023 04/10/15   Muthersbaugh, Dahlia Client, PA-C  guaiFENesin-dextromethorphan (ROBITUSSIN DM) 100-10 MG/5ML syrup Take 5 mLs by mouth every 4 (four) hours as needed for cough. Patient not taking: Reported on 06/07/2023 11/11/15   Jaynie Crumble, PA-C  ibuprofen (ADVIL) 800 MG tablet Take 800 mg by mouth every 8 (eight) hours as needed for mild pain (pain score 1-3), moderate pain (pain score 4-6) or headache.    [provider]  ibuprofen (ADVIL,MOTRIN) 600 MG tablet Take 1 tablet (600 mg total) by mouth every 6 (six) hours as needed. Patient not  taking: Reported on 06/07/2023 11/11/15   Jaynie Crumble, PA-C  levofloxacin (LEVAQUIN) 750 MG tablet Take 1 tablet (750 mg total) by mouth daily. X 7 days Patient not taking: Reported on 06/07/2023 10/07/14   Fayrene Helper, PA-C  lidocaine (LIDODERM) 5 % Place 1 patch onto the skin daily as needed (for pain). Remove & Discard patch within 12 hours or as directed by MD    [provider]  lidocaine (XYLOCAINE) 5 % ointment Apply 1 Application topically 2 (two) times daily as needed (for pain).    [provider]   lubiprostone (AMITIZA) 24 MCG capsule Take 24 mcg by mouth 2 (two) times daily with a meal.    [provider]  montelukast (SINGULAIR) 10 MG tablet Take 10 mg by mouth daily.    [provider]  olopatadine (PATANOL) 0.1 % ophthalmic solution Place 1 drop into both eyes 2 (two) times daily as needed for allergies.    [provider]  ondansetron (ZOFRAN) 4 MG tablet Take 1 tablet (4 mg total) by mouth every 8 (eight) hours as needed for nausea or vomiting. Patient not taking: Reported on 06/07/2023 10/07/14   Fayrene Helper, PA-C  oxyCODONE-acetaminophen (PERCOCET/ROXICET) 5-325 MG per tablet Take 1 tablet by mouth every 4 (four) hours as needed for severe pain. Patient not taking: Reported on 06/07/2023 01/31/14   Margarita Grizzle, MD  predniSONE (DELTASONE) 20 MG tablet Take 60 mg daily x 2 days then 40 mg daily x 2 days then 20 mg daily x 2 days Patient not taking: Reported on 10/07/2014 06/27/14   Charlynne Pander, MD  sodium chloride (OCEAN) 0.65 % nasal spray Place 1 spray into the nose as needed for congestion. 08/19/13   [provider]  traMADol (ULTRAM) 50 MG tablet Take 1 tablet (50 mg total) by mouth every 6 (six) hours as needed. Patient not taking: Reported on 06/07/2023 11/11/15   Jaynie Crumble, PA-C      Allergies    Amoxicillin-pot clavulanate, Azithromycin, Bee venom, Cephalexin, Covid-19 mrna vacc (moderna), Doxycycline, Iron sucrose, Metronidazole, Nitrofurantoin, Nitrofurantoin macrocrystal, Other, Peach [prunus persica], Penicillins, Sulfa antibiotics, Sumatriptan, Tomato, Vancomycin, Sertraline hcl, Amitriptyline, Ciprofloxacin, Darunavir, Erythromycin, Gabapentin, Iron, Nortriptyline, Quetiapine, Sertraline, Tape, Tramadol, Zonisamide, Clindamycin, Clindamycin/lincomycin, Latex, Lidocaine, and Sulfamethoxazole-trimethoprim    Review of Systems   Review of Systems  Physical Exam Updated Vital Signs BP (!) 140/103 (BP Location: Right Arm)    Pulse 81   Temp 98.3 F (36.8 C)   Resp 16   Ht 5\' 5"  (1.651 m)   Wt 90.7 kg   LMP  (LMP Unknown)   SpO2 100%   BMI 33.27 kg/m  Physical Exam Vitals and nursing note reviewed.  Constitutional:      General: She is not in acute distress.    Appearance: Normal appearance.  HENT:     Head: Normocephalic and atraumatic.     Mouth/Throat:     Mouth: Mucous membranes are moist.  Eyes:     Conjunctiva/sclera: Conjunctivae normal.     Pupils: Pupils are equal, round, and reactive to light.  Pulmonary:     Effort: Pulmonary effort is normal.  Musculoskeletal:        General: Swelling and tenderness present. Normal range of motion.     Comments: Swelling to the dorsum of the right foot compared to the left. TTP of pad of right foot, proximal to toes. No left ankle, calf, or thigh tenderness. Palpable dorsalis pulses bilaterally. ROM, strength, and sensation  intact of lower extremities bilaterally.   Skin:    General: Skin is warm and dry.     Findings: No rash.  Neurological:     General: No focal deficit present.     Mental Status: She is alert.     Sensory: No sensory deficit.     Motor: No weakness.  Psychiatric:        Mood and Affect: Mood normal.        Behavior: Behavior normal.    ED Results / Procedures / Treatments   Labs (all labs ordered are listed, but only abnormal results are displayed) Labs Reviewed  BASIC METABOLIC PANEL WITH GFR    EKG None  Radiology DG Foot Complete Right Result Date: 11/11/2023 CLINICAL DATA:  Foot pain after injury. EXAM: RIGHT FOOT COMPLETE - 3+ VIEW COMPARISON:  None Available. FINDINGS: There is no evidence of fracture or dislocation. Os navicularis typically incidental. There is no evidence of arthropathy or other focal bone abnormality. Soft tissues are unremarkable. IMPRESSION: Negative radiographs of the right foot. Electronically Signed   By: Narda Rutherford M.D.   On: 11/11/2023 21:23    Procedures Procedures: not  indicated. {Document cardiac monitor, telemetry assessment procedure when appropriate:1}  Medications Ordered in ED Medications  oxyCODONE-acetaminophen (PERCOCET/ROXICET) 5-325 MG per tablet 1 tablet (has no administration in time range)    ED Course/ Medical Decision Making/ A&P   {   Click here for ABCD2, HEART and other calculatorsREFRESH Note before signing :1}                              Medical Decision Making Amount and/or Complexity of Data Reviewed Labs: ordered.  Risk Prescription drug management.   This patient presents to the ED for concern of foot pain, this involves an extensive number of treatment options, and is a complaint that carries with it a high risk of complications and morbidity.   Differential diagnosis includes: fracture, dislocation, strain, sprain, ligamentous injury, contusion, plantar fasciitis, etc.   Comorbidities  See HPI above   Additional History  Additional history obtained from patient   Imaging Studies  I ordered imaging studies including right foot x-ray  I independently visualized and interpreted imaging which showed:  No evidence of fracture or dislocation of right foot.  Soft tissues are unremarkable. I agree with the radiologist interpretation   Problem List / ED Course / Critical Interventions / Medication Management  Pain to the bottom of the right foot for the past week I ordered medications including: Percocet for pain - given prior to discharge. Patient did not drive herself here.  I have reviewed the patients home medicines and have made adjustments as needed   Social Determinants of Health  Physical activity   Test / Admission - Considered  Discussed findings with patient.  All questions were answered. She is stable and safe for discharge home.   Return precautions given.   {Document critical care time when appropriate:1} {Document review of labs and clinical decision tools ie heart score, Chads2Vasc2  etc:1}  {Document your independent review of radiology images, and any outside records:1} {Document your discussion with family members, caretakers, and with consultants:1} {Document social determinants of health affecting pt's care:1} {Document your decision making why or why not admission, treatments were needed:1} Final Clinical Impression(s) / ED Diagnoses Final diagnoses:  None    Rx / DC Orders ED Discharge Orders     None

## 2023-11-11 NOTE — ED Provider Notes (Signed)
 [place] [facility]ERGENCY DEPARTMENT AT [facility] Provider Note   CSN: [id number] Arrival date & time: 11/11/23  2016     History  Chief Complaint  Patient presents with   Foot Pain    Monique Acosta is a 48 y.o. female with a history of low back pain and peripheral neuropathy presents the ED today for foot pain.  Patient reports that she was working out at her house a week ago, when she developed pain to the bottom of the right foot after doing cardio.  States that she woke up the next morning with increased pain.  Denies any known injury or trauma to the foot reports pain at the pad of the foot just proximal to the toes that radiates throughout the foot when she applies pressure with ambulation.  She has been taking Tylenol for the pain without improvement.  States that she noticed some swelling of her toes earlier today and was advised to come to the ED for further evaluation.  Denies pain to the ankle or left foot.  No additional complaints or concerns at this time.    Home Medications Prior to Admission medications   Medication Sig Start Date End Date Taking? Authorizing Provider  oxyCODONE (ROXICODONE) 5 MG immediate release tablet Take 1 tablet (5 mg total) by mouth every 6 (six) hours as needed for up to 5 days for severe pain (pain score 7-10). 11/11/23 11/16/23 Yes Maxwell Marion, PA-C  acetaminophen (TYLENOL) 500 MG tablet Take 500 mg by mouth 2 (two) times daily as needed for mild pain (pain score 1-3) or fever.    [provider]  albuterol (PROVENTIL HFA;VENTOLIN HFA) 108 (90 BASE) MCG/ACT inhaler Inhale 2 puffs into the lungs every 6 (six) hours as needed for wheezing or shortness of breath.    [provider]  albuterol (VENTOLIN HFA) 108 (90 Base) MCG/ACT inhaler Inhale 1-2 puffs into the lungs every 6 (six) hours as needed for wheezing or shortness of breath. 09/26/23   Horton, Mayer Masker, MD  ascorbic acid (VITAMIN C) 500 MG tablet Take 500 mg by mouth  daily.    [provider]  Aspirin-Salicylamide-Caffeine (BC HEADACHE POWDER PO) Take 1 packet by mouth daily as needed (for a severe headache or migraine).    [provider]  budesonide-formoterol (SYMBICORT) 80-4.5 MCG/ACT inhaler Inhale 2 puffs into the lungs 2 (two) times daily as needed (for shortness of breath).    [provider]  Carboxymethylcellulose Sod PF 0.5 % SOLN Place 1 drop into both eyes 2 (two) times daily as needed (for dryness). 06/20/22   [provider]  cetirizine (ZYRTEC) 10 MG tablet Take 10 mg by mouth at bedtime.    [provider]  EPINEPHrine 0.3 mg/0.3 mL IJ SOAJ injection Inject 0.3 mg into the muscle as needed for anaphylaxis.    [provider]  ergocalciferol (VITAMIN D2) 1.25 MG (50000 UT) capsule Take 50,000 Units by mouth every Monday.    [provider]  fluconazole (DIFLUCAN) 150 MG tablet 1 tablet every 72 hours until completed Patient not taking: Reported on 06/07/2023 04/10/15   Muthersbaugh, Dahlia Client, PA-C  guaiFENesin-dextromethorphan (ROBITUSSIN DM) 100-10 MG/5ML syrup Take 5 mLs by mouth every 4 (four) hours as needed for cough. Patient not taking: Reported on 06/07/2023 11/11/15   Jaynie Crumble, PA-C  ibuprofen (ADVIL) 800 MG tablet Take 800 mg by mouth every 8 (eight) hours as needed for mild pain (pain score 1-3), moderate pain (pain score 4-6)  or headache.    [provider]  ibuprofen (ADVIL,MOTRIN) 600 MG tablet Take 1 tablet (600 mg total) by mouth every 6 (six) hours as needed. Patient not taking: Reported on 06/07/2023 11/11/15   Jaynie Crumble, PA-C  levofloxacin (LEVAQUIN) 750 MG tablet Take 1 tablet (750 mg total) by mouth daily. X 7 days Patient not taking: Reported on 06/07/2023 10/07/14   Fayrene Helper, PA-C  lidocaine (LIDODERM) 5 % Place 1 patch onto the skin daily as needed (for pain). Remove & Discard patch within 12 hours or as directed by MD    [provider]  lidocaine (XYLOCAINE) 5 % ointment Apply 1 Application topically 2 (two) times daily as needed (for pain).    [provider]  lubiprostone (AMITIZA) 24 MCG capsule Take 24 mcg by mouth 2 (two) times daily with a meal.    [provider]  montelukast (SINGULAIR) 10 MG tablet Take 10 mg by mouth daily.    [provider]  olopatadine (PATANOL) 0.1 % ophthalmic solution Place 1 drop into both eyes 2 (two) times daily as needed for allergies.    [provider]  ondansetron (ZOFRAN) 4 MG tablet Take 1 tablet (4 mg total) by mouth every 8 (eight) hours as needed for nausea or vomiting. Patient not taking: Reported on 06/07/2023 10/07/14   Fayrene Helper, PA-C  oxyCODONE-acetaminophen (PERCOCET/ROXICET) 5-325 MG per tablet Take 1 tablet by mouth every 4 (four) hours as needed for severe pain. Patient not taking: Reported on 06/07/2023 01/31/14   Margarita Grizzle, MD  predniSONE (DELTASONE) 20 MG tablet Take 60 mg daily x 2 days then 40 mg daily x 2 days then 20 mg daily x 2 days Patient not taking: Reported on 10/07/2014 06/27/14   Charlynne Pander, MD  sodium chloride (OCEAN) 0.65 % nasal spray Place 1 spray into the nose as needed for congestion. 08/19/13   [provider]  traMADol (ULTRAM) 50 MG tablet Take 1 tablet (50 mg total) by mouth every 6 (six) hours as needed. Patient not taking: Reported on 06/07/2023 11/11/15   Jaynie Crumble, PA-C      Allergies    Amoxicillin-pot clavulanate, Azithromycin, Bee venom, Cephalexin, Covid-19 mrna vacc (moderna), Doxycycline, Iron sucrose, Metronidazole, Nitrofurantoin, Nitrofurantoin macrocrystal, Other, Peach [prunus persica], Penicillins, Sulfa antibiotics, Sumatriptan, Tomato, Vancomycin, Sertraline hcl, Amitriptyline, Ciprofloxacin, Darunavir, Erythromycin, Gabapentin, Iron, Nortriptyline, Quetiapine, Sertraline, Tape, Tramadol, Zonisamide, Clindamycin, Clindamycin/lincomycin, Latex, Lidocaine, and  Sulfamethoxazole-trimethoprim    Review of Systems   Review of Systems  Musculoskeletal:        Foot pain  All other systems reviewed and are negative.   Physical Exam Updated Vital Signs BP (!) 140/103 (BP Location: Right Arm)   Pulse 81   Temp 98.3 F (36.8 C)   Resp 16   Ht 5\' 5"  (1.651 m)   Wt 90.7 kg   LMP  (LMP Unknown)   SpO2 100%   BMI 33.27 kg/m  Physical Exam Vitals and nursing note reviewed.  Constitutional:      General: She is not in acute distress.    Appearance: Normal appearance.  HENT:     Head: Normocephalic and atraumatic.     Mouth/Throat:     Mouth: Mucous membranes are moist.  Eyes:     Conjunctiva/sclera: Conjunctivae normal.     Pupils: Pupils are equal, round, and reactive to light.  Pulmonary:     Effort: Pulmonary effort is normal.  Musculoskeletal:  General: Swelling and tenderness present. Normal range of motion.     Comments: Swelling to the dorsum of the right foot compared to the left. TTP of pad of right foot, proximal to toes. No left ankle, calf, or thigh tenderness. Palpable dorsalis pulses bilaterally. ROM, strength, and sensation intact of lower extremities bilaterally.   Skin:    General: Skin is warm and dry.     Findings: No rash.  Neurological:     General: No focal deficit present.     Mental Status: She is alert.     Sensory: No sensory deficit.     Motor: No weakness.  Psychiatric:        Mood and Affect: Mood normal.        Behavior: Behavior normal.    ED Results / Procedures / Treatments   Labs (all labs ordered are listed, but only abnormal results are displayed) Labs Reviewed - No data to display   EKG None  Radiology DG Foot Complete Right Result Date: 11/11/2023 CLINICAL DATA:  Foot pain after injury. EXAM: RIGHT FOOT COMPLETE - 3+ VIEW COMPARISON:  None Available. FINDINGS: There is no evidence of fracture or dislocation. Os navicularis typically incidental. There is no evidence of arthropathy or  other focal bone abnormality. Soft tissues are unremarkable. IMPRESSION: Negative radiographs of the right foot. Electronically Signed   By: Narda Rutherford M.D.   On: 11/11/2023 21:23    Procedures Procedures: not indicated.   Medications Ordered in ED Medications  oxyCODONE-acetaminophen (PERCOCET/ROXICET) 5-325 MG per tablet 1 tablet (1 tablet Oral Given 11/11/23 2332)    ED Course/ Medical Decision Making/ A&P                                 Medical Decision Making Amount and/or Complexity of Data Reviewed Labs: ordered.  Risk Prescription drug management.   This patient presents to the ED for concern of foot pain, this involves an extensive number of treatment options, and is a complaint that carries with it a high risk of complications and morbidity.   Differential diagnosis includes: fracture, dislocation, strain, sprain, ligamentous injury, contusion, plantar fasciitis, etc.   Comorbidities  See HPI above   Additional History  Additional history obtained from patient   Imaging Studies  I ordered imaging studies including right foot x-ray  I independently visualized and interpreted imaging which showed:  No evidence of fracture or dislocation of right foot.  Soft tissues are unremarkable. I agree with the radiologist interpretation   Problem List / ED Course / Critical Interventions / Medication Management  Pain to the bottom of the right foot for the past week. Patient reports using treadmill and elliptical machines at home last week, then trying a vibration plate exercise machine her daughter recently got.  States she had pain to the bottom of the right foot after this.  Woke up with worsening pain the next morning.  She has been taking Tylenol for her symptoms but had some swelling of the foot today so came in for further evaluation.  Pains at the plantar aspect of the right foot and is worse with ambulation/direct pressure.  She has been ambulating  independently since onset of symptoms despite pain. I ordered medications including: Percocet for pain - given prior to discharge. Patient did not drive herself here.  I have reviewed the patients home medicines and have made adjustments as needed   Social Determinants  of Health  Physical activity   Test / Admission - Considered  Discussed findings with patient.  All questions were answered. Information for orthopedics provided if pain persists. She is stable and safe for discharge home.   Return precautions given.       Final Clinical Impression(s) / ED Diagnoses Final diagnoses:  Contusion of right foot, initial encounter    Rx / DC Orders ED Discharge Orders          Ordered    oxyCODONE (ROXICODONE) 5 MG immediate release tablet  Every 6 hours PRN        11/11/23 2338              Maxwell Marion, PA-C 11/12/23 0006    Ernie Avena, MD 11/12/23 [phone]

## 2023-11-11 NOTE — Discharge Instructions (Addendum)
 Your foot x-ray is reassuring. No signs of broken bones in the foot. Wear the boot and use crutches as needed for pain. Take Tylenol 650 mg every 6-8 hours for pain. Take Oxycodone for breakthrough pain every 6 hours. This medication may cause drowsiness, do not drive or operate heavy machinery or take this medication.  It can also cause constipation.  Add MiraLAX in your medication regimen if you not having regular bowel movements.  Ice your foot and elevate it above heart level to help with swelling.  Follow-up with orthopedics if symptoms persist.  Get help right away if: You have very bad pain. Your foot or toes are numb. Your foot or toes turn very light (pale) or cold. You cannot move your foot or ankle.

## 2023-11-12 NOTE — Progress Notes (Signed)
 Orthopedic Tech Progress Note Patient Details:  Monique Acosta 12/28/1975 [id number]  Ortho Devices Type of Ortho Device: Crutches, CAM walker Ortho Device/Splint Location: rle Ortho Device/Splint Interventions: Ordered, Application, Adjustment   Post Interventions Patient Tolerated: Well Instructions Provided: Care of device, Adjustment of device  Trinna Post 11/12/2023, 12:02 AM

## 2023-11-27 ENCOUNTER — Emergency Department (HOSPITAL_COMMUNITY)
Admission: EM | Admit: 2023-11-27 | Discharge: 2023-11-27 | Disposition: A | Discharge status: Home / Self Care | Attending: Emergency Medicine | Admitting: Emergency Medicine

## 2023-11-27 ENCOUNTER — Other Ambulatory Visit: Payer: Self-pay

## 2023-11-27 DIAGNOSIS — L299 Pruritus, unspecified: Secondary | ICD-10-CM | POA: Diagnosis not present

## 2023-11-27 DIAGNOSIS — Z91040 Latex allergy status: Secondary | ICD-10-CM | POA: Insufficient documentation

## 2023-11-27 DIAGNOSIS — T7840XA Allergy, unspecified, initial encounter: Secondary | ICD-10-CM | POA: Diagnosis present

## 2023-11-27 MED ORDER — HYDROXYZINE HCL 25 MG PO TABS
25.0000 mg | ORAL_TABLET | Freq: Four times a day (QID) | ORAL | 0 refills | Status: AC
Start: 2023-11-27 — End: ?

## 2023-11-27 MED ORDER — METHYLPREDNISOLONE SODIUM SUCC 125 MG IJ SOLR
125.0000 mg | Freq: Once | INTRAMUSCULAR | Status: AC
  Administered 2023-11-27: 125 mg via INTRAVENOUS
  Filled 2023-11-27: qty 2

## 2023-11-27 MED ORDER — FAMOTIDINE IN NACL 20-0.9 MG/50ML-% IV SOLN
20.0000 mg | Freq: Once | INTRAVENOUS | Status: AC
  Administered 2023-11-27: 20 mg via INTRAVENOUS
  Filled 2023-11-27: qty 50

## 2023-11-27 MED ORDER — DIPHENHYDRAMINE HCL 50 MG/ML IJ SOLN
50.0000 mg | Freq: Once | INTRAMUSCULAR | Status: AC
  Administered 2023-11-27: 50 mg via INTRAVENOUS
  Filled 2023-11-27: qty 1

## 2023-11-27 MED ORDER — HYDROXYZINE HCL 25 MG PO TABS
50.0000 mg | ORAL_TABLET | Freq: Once | ORAL | Status: AC
  Administered 2023-11-27: 50 mg via ORAL
  Filled 2023-11-27: qty 2

## 2023-11-27 NOTE — Discharge Instructions (Addendum)
 As we discussed, your workup in the ER today was reassuring for acute findings.  Given your symptoms improved with the medicines we gave you, no further evaluation is indicated.  I have given you a prescription for Atarax  which you can take as prescribed as needed for any residual itching that you have after you take the other medicines you are prescribed at urgent care.  Ultimately, you may need to go back to an allergist to have additional testing or talk about other treatment methods such as allergy shots.  In the interim, recommend that you change around some of the soaps and detergents you are using to see if this will help your symptoms improve.  Follow-up with your primary care provider in the next few days as needed.  Return if development of any new or worsening symptoms.

## 2023-11-27 NOTE — ED Triage Notes (Signed)
 Pt ate coconut on Friday to which she has an allergy. Has been seen at [facility] and prescribed meds for allergic reaction. Has not gotten better. Pt is feeling worse, more itching, hives, throat tightness and itchiness. Pt feel dizzy. Has epi pen but has not used it. PA at bedside

## 2023-11-27 NOTE — ED Provider Notes (Signed)
 [place] EMERGENCY DEPARTMENT AT [facility] Provider Note   CSN: [id number] Arrival date & time: 11/27/23  1605     History  Chief Complaint  Patient presents with   Allergic Reaction    Monique Acosta is a 48 y.o. female.  Patient with no pertinent past medical history presents today with complaints of allergic reaction.  She states that 5 days ago she was in good health when she ate 2 slices of coconut cake.  Soon after that she felt diffuse itching throughout her body.  She assumed she was having allergic reaction to the cake as she has numerous drug and food allergies.  She assumed it would pass, however did not and so she went to urgent care and was prescribed Pepcid , steroids, and Benadryl .  She has been taking these without any improvement in her symptoms.  She continues to have diffuse itching.  She does not have a rash.  She does not feel like her throat is closing.  She does have an EpiPen at home but has not felt like it was serious enough to use it.  She denies any other complaints.  No other new exposures.  No fevers, chills, joint pain, abdominal pain, nausea, or vomiting.  The history is provided by the patient. No language interpreter was used.  Allergic Reaction      Home Medications Prior to Admission medications   Medication Sig Start Date End Date Taking? Authorizing Provider  acetaminophen  (TYLENOL ) 500 MG tablet Take 500 mg by mouth 2 (two) times daily as needed for mild pain (pain score 1-3) or fever.    [provider]  albuterol  (PROVENTIL  HFA;VENTOLIN  HFA) 108 (90 BASE) MCG/ACT inhaler Inhale 2 puffs into the lungs every 6 (six) hours as needed for wheezing or shortness of breath.    [provider]  albuterol  (VENTOLIN  HFA) 108 (90 Base) MCG/ACT inhaler Inhale 1-2 puffs into the lungs every 6 (six) hours as needed for wheezing or shortness of breath. 09/26/23   Horton, Vonzella Guernsey, MD  ascorbic acid (VITAMIN C) 500 MG tablet Take  500 mg by mouth daily.    [provider]  Aspirin -Salicylamide-Caffeine (BC HEADACHE POWDER PO) Take 1 packet by mouth daily as needed (for a severe headache or migraine).    [provider]  budesonide-formoterol (SYMBICORT) 80-4.5 MCG/ACT inhaler Inhale 2 puffs into the lungs 2 (two) times daily as needed (for shortness of breath).    [provider]  Carboxymethylcellulose Sod PF 0.5 % SOLN Place 1 drop into both eyes 2 (two) times daily as needed (for dryness). 06/20/22   [provider]  cetirizine (ZYRTEC) 10 MG tablet Take 10 mg by mouth at bedtime.    [provider]  EPINEPHrine 0.3 mg/0.3 mL IJ SOAJ injection Inject 0.3 mg into the muscle as needed for anaphylaxis.    [provider]  ergocalciferol (VITAMIN D2) 1.25 MG (50000 UT) capsule Take 50,000 Units by mouth every Monday.    [provider]  fluconazole  (DIFLUCAN ) 150 MG tablet 1 tablet every 72 hours until completed Patient not taking: Reported on 06/07/2023 04/10/15   Muthersbaugh, Alisa App, PA-C  guaiFENesin -dextromethorphan (ROBITUSSIN DM) 100-10 MG/5ML syrup Take 5 mLs by mouth every 4 (four) hours as needed for cough. Patient not taking: Reported on 06/07/2023 11/11/15   Kirichenko, Tatyana, PA-C  ibuprofen  (ADVIL ) 800 MG tablet Take 800 mg by mouth every 8 (eight) hours as needed for mild pain (pain score 1-3), moderate pain (pain  score 4-6) or headache.    [provider]  ibuprofen  (ADVIL ,MOTRIN ) 600 MG tablet Take 1 tablet (600 mg total) by mouth every 6 (six) hours as needed. Patient not taking: Reported on 06/07/2023 11/11/15   Kirichenko, Tatyana, PA-C  levofloxacin  (LEVAQUIN ) 750 MG tablet Take 1 tablet (750 mg total) by mouth daily. X 7 days Patient not taking: Reported on 06/07/2023 10/07/14   Debbra Fairy, PA-C  lidocaine (LIDODERM) 5 % Place 1 patch onto the skin daily as needed (for pain). Remove & Discard patch within 12 hours or as directed by MD     [provider]  lidocaine (XYLOCAINE) 5 % ointment Apply 1 Application topically 2 (two) times daily as needed (for pain).    [provider]  lubiprostone (AMITIZA) 24 MCG capsule Take 24 mcg by mouth 2 (two) times daily with a meal.    [provider]  montelukast (SINGULAIR) 10 MG tablet Take 10 mg by mouth daily.    [provider]  olopatadine (PATANOL) 0.1 % ophthalmic solution Place 1 drop into both eyes 2 (two) times daily as needed for allergies.    [provider]  ondansetron  (ZOFRAN ) 4 MG tablet Take 1 tablet (4 mg total) by mouth every 8 (eight) hours as needed for nausea or vomiting. Patient not taking: Reported on 06/07/2023 10/07/14   Debbra Fairy, PA-C  oxyCODONE -acetaminophen  (PERCOCET/ROXICET) 5-325 MG per tablet Take 1 tablet by mouth every 4 (four) hours as needed for severe pain. Patient not taking: Reported on 06/07/2023 01/31/14   Auston Blush, MD  predniSONE  (DELTASONE ) 20 MG tablet Take 60 mg daily x 2 days then 40 mg daily x 2 days then 20 mg daily x 2 days Patient not taking: Reported on 10/07/2014 06/27/14   Dalene Duck, MD  sodium chloride  (OCEAN) 0.65 % nasal spray Place 1 spray into the nose as needed for congestion. 08/19/13   [provider]  traMADol  (ULTRAM ) 50 MG tablet Take 1 tablet (50 mg total) by mouth every 6 (six) hours as needed. Patient not taking: Reported on 06/07/2023 11/11/15   Kirichenko, Tatyana, PA-C      Allergies    Amoxicillin-pot clavulanate, Azithromycin, Bee venom, Cephalexin, Covid-19 mrna vacc (moderna), Doxycycline, Iron sucrose, Metronidazole, Nitrofurantoin, Nitrofurantoin macrocrystal, Other, Peach [prunus persica], Penicillins, Sulfa antibiotics, Sumatriptan, Tomato, Vancomycin, Sertraline hcl, Amitriptyline, Ciprofloxacin, Darunavir, Erythromycin, Gabapentin , Iron, Nortriptyline, Quetiapine, Sertraline, Tape, Tramadol , Zonisamide, Clindamycin, Clindamycin/lincomycin, Latex,  Lidocaine, and Sulfamethoxazole-trimethoprim    Review of Systems   Review of Systems  All other systems reviewed and are negative.   Physical Exam Updated Vital Signs BP (!) 171/115 (BP Location: Right Arm)   Pulse 96   Temp 98.9 F (37.2 C)   Resp 20   Ht 5\' 5"  (1.651 m)   Wt 90.7 kg   LMP  (LMP Unknown)   SpO2 100%   BMI 33.27 kg/m  Physical Exam Vitals and nursing note reviewed.  Constitutional:      General: She is not in acute distress.    Appearance: Normal appearance. She is normal weight. She is not ill-appearing, toxic-appearing or diaphoretic.  HENT:     Head: Normocephalic and atraumatic.     Mouth/Throat:     Comments: No intraoral lesions.  No tongue or lip swelling. Cardiovascular:     Rate and Rhythm: Normal rate.  Pulmonary:     Effort: Pulmonary effort is normal. No respiratory distress.     Breath sounds: No stridor.  Comments: Speaking in complete sentences Musculoskeletal:        General: Normal range of motion.     Cervical back: Normal range of motion.  Skin:    General: Skin is warm and dry.     Comments: No rashes  Neurological:     General: No focal deficit present.     Mental Status: She is alert.  Psychiatric:        Mood and Affect: Mood normal.        Behavior: Behavior normal.     ED Results / Procedures / Treatments   Labs (all labs ordered are listed, but only abnormal results are displayed) Labs Reviewed - No data to display  EKG None  Radiology No results found.  Procedures Procedures    Medications Ordered in ED Medications  methylPREDNISolone  sodium succinate (SOLU-MEDROL ) 125 mg/2 mL injection 125 mg (125 mg Intravenous Given 11/27/23 1625)  famotidine  (PEPCID ) IVPB 20 mg premix (0 mg Intravenous Stopped 11/27/23 2125)  diphenhydrAMINE  (BENADRYL ) injection 50 mg (50 mg Intravenous Given 11/27/23 1625)  hydrOXYzine  (ATARAX ) tablet 50 mg (50 mg Oral Given 11/27/23 2315)    ED Course/ Medical Decision Making/  A&P                                 Medical Decision Making Risk Prescription drug management.   Patient with history of numerous drug allergies presents today with complaints of pruritus.  Same has been ongoing for 5 days.  Saw urgent care and was prescribed steroids, Benadryl , and Pepcid .  She has been taking this with brief improvement, however it does not last very long.  No new exposures.  No fevers or chills.  She presents for same.  She was given IV steroids, Benadryl , and Pepcid  today and states she did have a few hours of relief, however upon my evaluation given patient's extended wait time her symptoms have returned.  She is persistently scratching her skin upon my evaluation.  I do not see any rashes.  She has no signs of intraoral swelling or lesions.  She is speaking in complete sentences and there is no stridor.  Given Atarax  with some improvement.  Will send her home with same.  No indication for EpiPen at this time.  I suspect that she is persistently exposing her to some allergen that she is unaware of, given her duration of symptoms.  Chart reviewed, she does have 35 drug and food allergies.  No signs or symptoms to suspect other etiology of symptoms requiring labs or imaging.  Very low suspicion for liver disease.  She states she has seen an allergist before but has never had allergy shots, this may benefit her.  Discussed today with patient is understanding.  Recommend she continue to take the medicines prescribed to her at urgent care.  She already has an EpiPen, discussed signs and symptoms that would require administration of this medicine.  Recommend she try some different soaps and detergents to see if this will benefit her.  Evaluation and diagnostic testing in the emergency department does not suggest an emergent condition requiring admission or immediate intervention beyond what has been performed at this time.  Plan for discharge with close PCP follow-up.  Patient is  understanding and amenable with plan, educated on red flag symptoms that would prompt immediate return.  Patient discharged in stable condition.  Findings and plan of care discussed with supervising physician Dr.  Plunkett who is in agreement.   Final Clinical Impression(s) / ED Diagnoses Final diagnoses:  Pruritus    Rx / DC Orders ED Discharge Orders          Ordered    hydrOXYzine  (ATARAX ) 25 MG tablet  Every 6 hours        11/27/23 2332          An After Visit Summary was printed and given to the patient.     Fredna Jasper 11/27/23 2334    Almond Army, MD 11/28/23 [phone]

## 2023-11-27 NOTE — ED Provider Triage Note (Signed)
 Emergency Medicine Provider Triage Evaluation Note  Monique Acosta , a 48 y.o. female  was evaluated in triage.  Pt complains of allergic reaction to coconut.  States she ate coconut 5 days ago and is allergic to this.  She was seen by urgent care and given a steroid, but states this has not improved her symptoms.  Reports an itchy rash that developed today.  Denies any difficulty breathing, nausea or vomiting, feelings of dizziness.  Patient has an EpiPen but has not used it.  Review of Systems  Positive: As above Negative: As above  Physical Exam  BP (!) 171/115 (BP Location: Right Arm)   Pulse 96   Temp 98.9 F (37.2 C)   Resp 20   Ht 5\' 5"  (1.651 m)   Wt 90.7 kg   LMP  (LMP Unknown)   SpO2 100%   BMI 33.27 kg/m  Gen:   Awake, no distress   Resp:  Normal effort  MSK:   Moves extremities without difficulty  Other:  Talking in full sentences without difficulty.  No stridor appreciated.  No hives appreciated.  No nausea or vomiting.  No concern for anaphylaxis at this time.  Medical Decision Making  Medically screening exam initiated at 4:20 PM.  Appropriate orders placed.  Monique Acosta was informed that the remainder of the evaluation will be completed by another provider, this initial triage assessment does not replace that evaluation, and the importance of remaining in the ED until their evaluation is complete.  Started IV methylprednisolone , Benadryl , Pepcid  here in triage.  Will continue to monitor patient.   Monique Catena, PA-C 11/27/23 1621

## 2024-02-18 ENCOUNTER — Emergency Department (HOSPITAL_COMMUNITY)
Admission: EM | Admit: 2024-02-18 | Discharge: 2024-02-18 | Disposition: A | Discharge status: Home / Self Care | Attending: Emergency Medicine | Admitting: Emergency Medicine

## 2024-02-18 ENCOUNTER — Emergency Department (HOSPITAL_COMMUNITY)

## 2024-02-18 DIAGNOSIS — Z91040 Latex allergy status: Secondary | ICD-10-CM | POA: Insufficient documentation

## 2024-02-18 DIAGNOSIS — T17320A Food in larynx causing asphyxiation, initial encounter: Secondary | ICD-10-CM | POA: Insufficient documentation

## 2024-02-18 DIAGNOSIS — X58XXXA Exposure to other specified factors, initial encounter: Secondary | ICD-10-CM | POA: Diagnosis not present

## 2024-02-18 DIAGNOSIS — Z7982 Long term (current) use of aspirin: Secondary | ICD-10-CM | POA: Insufficient documentation

## 2024-02-18 DIAGNOSIS — E041 Nontoxic single thyroid nodule: Secondary | ICD-10-CM | POA: Insufficient documentation

## 2024-02-18 LAB — I-STAT CHEM 8, ED
BUN: 16 mg/dL (ref 6–20)
Calcium, Ion: 1.19 mmol/L (ref 1.15–1.40)
Chloride: 104 mmol/L (ref 98–111)
Creatinine, Ser: 0.6 mg/dL (ref 0.44–1.00)
Glucose, Bld: 93 mg/dL (ref 70–99)
HCT: 39 % (ref 36.0–46.0)
Hemoglobin: 13.3 g/dL (ref 12.0–15.0)
Potassium: 3.7 mmol/L (ref 3.5–5.1)
Sodium: 140 mmol/L (ref 135–145)
TCO2: 25 mmol/L (ref 22–32)

## 2024-02-18 MED ORDER — IOHEXOL 350 MG/ML SOLN
75.0000 mL | Freq: Once | INTRAVENOUS | Status: AC | PRN
  Administered 2024-02-18: 75 mL via INTRAVENOUS

## 2024-02-18 MED ORDER — OMEPRAZOLE 20 MG PO CPDR: 20.0000 mg | DELAYED_RELEASE_CAPSULE | Freq: Every day | ORAL | 0 refills | Status: AC

## 2024-02-18 MED ORDER — DEXAMETHASONE SODIUM PHOSPHATE 10 MG/ML IJ SOLN
10.0000 mg | Freq: Once | INTRAMUSCULAR | Status: AC
  Administered 2024-02-18: 10 mg via INTRAVENOUS
  Filled 2024-02-18: qty 1

## 2024-02-18 NOTE — ED Provider Notes (Signed)
 [place] EMERGENCY DEPARTMENT AT [facility] Provider Note   CSN: [id number] Arrival date & time: 02/18/24  1334     Patient presents with: Choking   Monique Acosta is a 48 y.o. female.  She is brought in by ambulance after a choking episode at home.  Reportedly was eating some food and felt something get stuck in her throat had troubles breathing and turned colors.  Daughter did the Heimlich and she eventually vomited up some material.  They took a picture of what looks like a piece of plastic or metal.  She continues to have discomfort in her throat and some soreness of her upper abdomen.  No difficulty breathing though.  History of vocal cord dysfunction after prior endoscopy.  No hemoptysis.  {Add pertinent medical, surgical, social history, OB history to YEP:67052} The history is provided by the patient.  Sore Throat This is a new problem. The current episode started 3 to 5 hours ago. The problem occurs constantly. The problem has not changed since onset.Associated symptoms include abdominal pain and shortness of breath. Pertinent negatives include no chest pain and no headaches. The symptoms are aggravated by swallowing. Nothing relieves the symptoms. She has tried nothing for the symptoms. The treatment provided no relief.       Prior to Admission medications   Medication Sig Start Date End Date Taking? Authorizing Provider  acetaminophen  (TYLENOL ) 500 MG tablet Take 500 mg by mouth 2 (two) times daily as needed for mild pain (pain score 1-3) or fever.    [provider]  albuterol  (PROVENTIL  HFA;VENTOLIN  HFA) 108 (90 BASE) MCG/ACT inhaler Inhale 2 puffs into the lungs every 6 (six) hours as needed for wheezing or shortness of breath.    [provider]  albuterol  (VENTOLIN  HFA) 108 (90 Base) MCG/ACT inhaler Inhale 1-2 puffs into the lungs every 6 (six) hours as needed for wheezing or shortness of breath. 09/26/23   Horton, Charmaine FALCON, MD  ascorbic acid  (VITAMIN C) 500 MG tablet Take 500 mg by mouth daily.    [provider]  Aspirin -Salicylamide-Caffeine (BC HEADACHE POWDER PO) Take 1 packet by mouth daily as needed (for a severe headache or migraine).    [provider]  budesonide-formoterol (SYMBICORT) 80-4.5 MCG/ACT inhaler Inhale 2 puffs into the lungs 2 (two) times daily as needed (for shortness of breath).    [provider]  Carboxymethylcellulose Sod PF 0.5 % SOLN Place 1 drop into both eyes 2 (two) times daily as needed (for dryness). 06/20/22   [provider]  cetirizine (ZYRTEC) 10 MG tablet Take 10 mg by mouth at bedtime.    [provider]  EPINEPHrine 0.3 mg/0.3 mL IJ SOAJ injection Inject 0.3 mg into the muscle as needed for anaphylaxis.    [provider]  ergocalciferol (VITAMIN D2) 1.25 MG (50000 UT) capsule Take 50,000 Units by mouth every Monday.    [provider]  fluconazole  (DIFLUCAN ) 150 MG tablet 1 tablet every 72 hours until completed Patient not taking: Reported on 06/07/2023 04/10/15   Muthersbaugh, Chiquita, PA-C  guaiFENesin -dextromethorphan (ROBITUSSIN DM) 100-10 MG/5ML syrup Take 5 mLs by mouth every 4 (four) hours as needed for cough. Patient not taking: Reported on 06/07/2023 11/11/15   Kirichenko, Tatyana, PA-C  hydrOXYzine  (ATARAX ) 25 MG tablet Take 1 tablet (25 mg total) by mouth every 6 (six) hours. 11/27/23   Smoot, Lauraine LABOR, PA-C  ibuprofen  (ADVIL ) 800 MG tablet Take 800 mg by mouth every 8 (eight) hours  as needed for mild pain (pain score 1-3), moderate pain (pain score 4-6) or headache.    [provider]  ibuprofen  (ADVIL ,MOTRIN ) 600 MG tablet Take 1 tablet (600 mg total) by mouth every 6 (six) hours as needed. Patient not taking: Reported on 06/07/2023 11/11/15   Kirichenko, Tatyana, PA-C  levofloxacin  (LEVAQUIN ) 750 MG tablet Take 1 tablet (750 mg total) by mouth daily. X 7 days Patient not taking: Reported on 06/07/2023 10/07/14   Nivia Colon, PA-C  lidocaine (LIDODERM) 5 % Place 1 patch onto the skin daily as needed (for pain). Remove & Discard patch within 12 hours or as directed by MD    [provider]  lidocaine (XYLOCAINE) 5 % ointment Apply 1 Application topically 2 (two) times daily as needed (for pain).    [provider]  lubiprostone (AMITIZA) 24 MCG capsule Take 24 mcg by mouth 2 (two) times daily with a meal.    [provider]  montelukast (SINGULAIR) 10 MG tablet Take 10 mg by mouth daily.    [provider]  olopatadine (PATANOL) 0.1 % ophthalmic solution Place 1 drop into both eyes 2 (two) times daily as needed for allergies.    [provider]  ondansetron  (ZOFRAN ) 4 MG tablet Take 1 tablet (4 mg total) by mouth every 8 (eight) hours as needed for nausea or vomiting. Patient not taking: Reported on 06/07/2023 10/07/14   Nivia Colon, PA-C  oxyCODONE -acetaminophen  (PERCOCET/ROXICET) 5-325 MG per tablet Take 1 tablet by mouth every 4 (four) hours as needed for severe pain. Patient not taking: Reported on 06/07/2023 01/31/14   Levander Houston, MD  predniSONE  (DELTASONE ) 20 MG tablet Take 60 mg daily x 2 days then 40 mg daily x 2 days then 20 mg daily x 2 days Patient not taking: Reported on 10/07/2014 06/27/14   Patt Alm Macho, MD  sodium chloride  (OCEAN) 0.65 % nasal spray Place 1 spray into the nose as needed for congestion. 08/19/13   [provider]  traMADol  (ULTRAM ) 50 MG tablet Take 1 tablet (50 mg total) by mouth every 6 (six) hours as needed. Patient not taking: Reported on 06/07/2023 11/11/15   Kirichenko, Tatyana, PA-C    Allergies: Amoxicillin-pot clavulanate, Azithromycin, Bee venom, Cephalexin, Covid-19 mrna vacc (moderna), Doxycycline, Iron sucrose, Metronidazole, Nitrofurantoin, Nitrofurantoin macrocrystal, Other, Peach [prunus persica], Penicillins, Sulfa antibiotics, Sumatriptan, Tomato, Vancomycin, Sertraline hcl, Amitriptyline, Ciprofloxacin,  Darunavir, Erythromycin, Gabapentin , Iron, Nortriptyline, Quetiapine, Sertraline, Tape, Tramadol , Zonisamide, Clindamycin, Clindamycin/lincomycin, Latex, Lidocaine, and Sulfamethoxazole-trimethoprim    Review of Systems  HENT:  Positive for sore throat and trouble swallowing.   Respiratory:  Positive for shortness of breath.   Cardiovascular:  Negative for chest pain.  Gastrointestinal:  Positive for abdominal pain.  Neurological:  Negative for headaches.    Updated Vital Signs BP (!) 155/106 (BP Location: Left Arm)   Pulse 89   Temp 98.7 F (37.1 C) (Oral)   Resp 18   LMP  (LMP Unknown)   SpO2 98%   Physical Exam Vitals and nursing note reviewed.  Constitutional:      General: She is not in acute distress.    Appearance: Normal appearance. She is well-developed.  HENT:     Head: Normocephalic and atraumatic.     Mouth/Throat:     Mouth: Mucous membranes are moist.     Pharynx: Oropharynx is clear.  Eyes:     Conjunctiva/sclera: Conjunctivae normal.  Cardiovascular:     Rate and Rhythm: Normal rate and regular rhythm.  Heart sounds: No murmur heard. Pulmonary:     Effort: Pulmonary effort is normal. No respiratory distress.     Breath sounds: Normal breath sounds. No stridor. No wheezing.  Abdominal:     Palpations: Abdomen is soft.     Tenderness: There is no abdominal tenderness. There is no guarding or rebound.  Musculoskeletal:        General: No tenderness or deformity. Normal range of motion.     Cervical back: Neck supple. No tenderness.  Skin:    General: Skin is warm and dry.  Neurological:     General: No focal deficit present.     Mental Status: She is alert.     GCS: GCS eye subscore is 4. GCS verbal subscore is 5. GCS motor subscore is 6.     (all labs ordered are listed, but only abnormal results are displayed) Labs Reviewed  I-STAT CHEM 8, ED    EKG: None  Radiology: No results found.  {Document cardiac monitor, telemetry assessment  procedure when appropriate:32947} Procedures   Medications Ordered in the ED  dexamethasone  (DECADRON ) injection 10 mg (10 mg Intravenous Given 02/18/24 1526)      {Click here for ABCD2, HEART and other calculators REFRESH Note before signing:1}                              Medical Decision Making  This patient complains of ***; this involves an extensive number of treatment Options and is a complaint that carries with it a high risk of complications and morbidity. The differential includes ***  I ordered, reviewed and interpreted labs, which included *** I ordered medication *** and reviewed PMP when indicated. I ordered imaging studies which included *** and I independently    visualized and interpreted imaging which showed *** Additional history obtained from *** Previous records obtained and reviewed *** I consulted *** and discussed lab and imaging findings and discussed disposition.  Cardiac monitoring reviewed, *** Social determinants considered, *** Critical Interventions: ***  After the interventions stated above, I reevaluated the patient and found *** Admission and further testing considered, ***   {Document critical care time when appropriate  Document review of labs and clinical decision tools ie CHADS2VASC2, etc  Document your independent review of radiology images and any outside records  Document your discussion with family members, caretakers and with consultants  Document social determinants of health affecting pt's care  Document your decision making why or why not admission, treatments were needed:32947:::1}   Final diagnoses:  None    ED Discharge Orders     None

## 2024-02-18 NOTE — ED Triage Notes (Addendum)
 PT arrived via [facility] from home for chocking while eating. Daughter performed abdominal thrusts clearing obstruction, and causing patient to vomit. In material thrown up, a sharp white object was present. Airway clear upon arrival of EMS, good airway movement, but reports continued pain and burning sensation in throat. PTA EMS Vitals 168/82, 85 NS, 99% RA, RR 18

## 2024-02-18 NOTE — ED Provider Triage Note (Signed)
 Emergency Medicine Provider Triage Evaluation Note  Monique Acosta , a 48 y.o. female  was evaluated in triage.  Pt complains of choking today.  She started eating some leftovers and then felt like she got something stuck in her throat.  She was unable to breathe even starting to turn colors and her daughter started doing the Heimlich.  She eventually vomited everything up but was continually feeling short of breath and still feels like there is a lump in her throat.  She has a picture of what she threw up that was in the meat and it appeared to be a piece of metal.  She is able to tolerate secretions.  History of vocal cord dysfunction after an endoscopy earlier this year.  Review of Systems  Positive: Shortness of breath, lump in her throat, abdominal pain where her daughter did the Heimlich Negative: No chest pain or stridor  Physical Exam  BP (!) 155/106 (BP Location: Left Arm)   Pulse 89   Temp 98.7 F (37.1 C) (Oral)   Resp 18   LMP  (LMP Unknown)   SpO2 98%  Gen:   Awake, no distress   Resp:  Normal effort, without stridor or wheezing MSK:   Moves extremities without difficulty  Other:  Mild epigastric abdominal pain  Medical Decision Making  Medically screening exam initiated at 2:31 PM.  Appropriate orders placed.  Sherian Valenza was informed that the remainder of the evaluation will be completed by another provider, this initial triage assessment does not replace that evaluation, and the importance of remaining in the ED until their evaluation is complete.     Doretha Folks, MD 02/18/24 1432

## 2024-02-18 NOTE — Discharge Instructions (Signed)
 Soft diet.  Keep well-hydrated.  Throat symptoms should resolve over a few days.  You did have a thyroid nodule that require further testing.  Please contact your primary care team.  Return if any worsening or concerning symptoms

## 2024-07-28 ENCOUNTER — Encounter (HOSPITAL_COMMUNITY): Payer: Self-pay

## 2024-07-28 ENCOUNTER — Emergency Department (HOSPITAL_COMMUNITY)

## 2024-07-28 ENCOUNTER — Emergency Department (HOSPITAL_COMMUNITY)
Admission: EM | Admit: 2024-07-28 | Discharge: 2024-07-28 | Disposition: A | Discharge status: Home / Self Care | Attending: Emergency Medicine | Admitting: Emergency Medicine

## 2024-07-28 ENCOUNTER — Other Ambulatory Visit: Payer: Self-pay

## 2024-07-28 DIAGNOSIS — R5383 Other fatigue: Secondary | ICD-10-CM | POA: Diagnosis not present

## 2024-07-28 DIAGNOSIS — R079 Chest pain, unspecified: Secondary | ICD-10-CM | POA: Insufficient documentation

## 2024-07-28 DIAGNOSIS — R509 Fever, unspecified: Secondary | ICD-10-CM | POA: Diagnosis not present

## 2024-07-28 DIAGNOSIS — J029 Acute pharyngitis, unspecified: Secondary | ICD-10-CM | POA: Diagnosis present

## 2024-07-28 DIAGNOSIS — R0602 Shortness of breath: Secondary | ICD-10-CM | POA: Diagnosis not present

## 2024-07-28 DIAGNOSIS — J111 Influenza due to unidentified influenza virus with other respiratory manifestations: Secondary | ICD-10-CM

## 2024-07-28 DIAGNOSIS — R0981 Nasal congestion: Secondary | ICD-10-CM | POA: Insufficient documentation

## 2024-07-28 DIAGNOSIS — R110 Nausea: Secondary | ICD-10-CM | POA: Insufficient documentation

## 2024-07-28 DIAGNOSIS — R059 Cough, unspecified: Secondary | ICD-10-CM | POA: Insufficient documentation

## 2024-07-28 LAB — CBC
HCT: 39.6 % (ref 36.0–46.0)
Hemoglobin: 13.4 g/dL (ref 12.0–15.0)
MCH: 28.2 pg (ref 26.0–34.0)
MCHC: 33.8 g/dL (ref 30.0–36.0)
MCV: 83.4 fL (ref 80.0–100.0)
Platelets: 275 K/uL (ref 150–400)
RBC: 4.75 MIL/uL (ref 3.87–5.11)
RDW: 12.9 % (ref 11.5–15.5)
WBC: 6.9 K/uL (ref 4.0–10.5)
nRBC: 0 % (ref 0.0–0.2)

## 2024-07-28 LAB — BASIC METABOLIC PANEL WITH GFR
Anion gap: 9 (ref 5–15)
BUN: 9 mg/dL (ref 6–20)
CO2: 27 mmol/L (ref 22–32)
Calcium: 9.4 mg/dL (ref 8.9–10.3)
Chloride: 101 mmol/L (ref 98–111)
Creatinine, Ser: 0.52 mg/dL (ref 0.44–1.00)
GFR, Estimated: >60 mL/min
Glucose, Bld: 88 mg/dL (ref 70–99)
Potassium: 3.6 mmol/L (ref 3.5–5.1)
Sodium: 138 mmol/L (ref 135–145)

## 2024-07-28 LAB — TROPONIN T, HIGH SENSITIVITY: Troponin T High Sensitivity: <15 ng/L (ref 0–19)

## 2024-07-28 MED ORDER — GUAIFENESIN-CODEINE 100-10 MG/5ML PO SOLN: 5.0000 mL | Freq: Two times a day (BID) | ORAL | 0 refills | Status: AC | PRN

## 2024-07-28 MED ORDER — IPRATROPIUM-ALBUTEROL 0.5-2.5 (3) MG/3ML IN SOLN
3.0000 mL | Freq: Once | RESPIRATORY_TRACT | Status: AC
  Administered 2024-07-28: 3 mL via RESPIRATORY_TRACT
  Filled 2024-07-28: qty 3

## 2024-07-28 MED ORDER — SODIUM CHLORIDE 0.9 % IV BOLUS
1000.0000 mL | Freq: Once | INTRAVENOUS | Status: AC
  Administered 2024-07-28: 1000 mL via INTRAVENOUS

## 2024-07-28 MED ORDER — ACETAMINOPHEN 500 MG PO TABS
1000.0000 mg | ORAL_TABLET | Freq: Once | ORAL | Status: AC
  Administered 2024-07-28: 1000 mg via ORAL
  Filled 2024-07-28: qty 2

## 2024-07-28 NOTE — ED Provider Notes (Signed)
 " [place] EMERGENCY DEPARTMENT AT [facility] Provider Note   CSN: [id number] Arrival date & time: 07/28/24  1126     Patient presents with: Cough and Sore Throat   Monique Acosta is a 48 y.o. female.   HPI 48 year old female presents with respiratory symptoms for about 5 days.  She has been having sore throat, cough with green sputum, congestion, and losing her voice.  Has been having fevers as well as chest pain and rib pain.  She states she has been feeling dizzy when she walks and is only taking in fluids rather than food.  She denies any abdominal pain but does get nauseated at times.  This morning she woke up and there was blood in her nasal congestion and so she called her doctor who told her to go to the ER.  However there is specifically no hemoptysis.  She states she has been using her albuterol  inhaler but it has not been helping.  She denies a history of asthma but states she has significant allergies.  Prior to Admission medications  Medication Sig Start Date End Date Taking? Authorizing Provider  guaiFENesin -codeine  100-10 MG/5ML syrup Take 5 mLs by mouth 2 (two) times daily as needed for cough. 07/28/24  Yes Freddi Hamilton, MD  acetaminophen  (TYLENOL ) 500 MG tablet Take 500 mg by mouth 2 (two) times daily as needed for mild pain (pain score 1-3) or fever.    [provider]  albuterol  (PROVENTIL  HFA;VENTOLIN  HFA) 108 (90 BASE) MCG/ACT inhaler Inhale 2 puffs into the lungs every 6 (six) hours as needed for wheezing or shortness of breath.    [provider]  albuterol  (VENTOLIN  HFA) 108 (90 Base) MCG/ACT inhaler Inhale 1-2 puffs into the lungs every 6 (six) hours as needed for wheezing or shortness of breath. 09/26/23   Horton, Charmaine FALCON, MD  ascorbic acid (VITAMIN C) 500 MG tablet Take 500 mg by mouth daily.    [provider]  Aspirin -Salicylamide-Caffeine (BC HEADACHE POWDER PO) Take 1 packet by mouth daily as needed (for a severe  headache or migraine).    [provider]  budesonide-formoterol (SYMBICORT) 80-4.5 MCG/ACT inhaler Inhale 2 puffs into the lungs 2 (two) times daily as needed (for shortness of breath).    [provider]  Carboxymethylcellulose Sod PF 0.5 % SOLN Place 1 drop into both eyes 2 (two) times daily as needed (for dryness). 06/20/22   [provider]  cetirizine (ZYRTEC) 10 MG tablet Take 10 mg by mouth at bedtime.    [provider]  EPINEPHrine 0.3 mg/0.3 mL IJ SOAJ injection Inject 0.3 mg into the muscle as needed for anaphylaxis.    [provider]  ergocalciferol (VITAMIN D2) 1.25 MG (50000 UT) capsule Take 50,000 Units by mouth every Monday.    [provider]  fluconazole  (DIFLUCAN ) 150 MG tablet 1 tablet every 72 hours until completed Patient not taking: Reported on 06/07/2023 04/10/15   Muthersbaugh, Chiquita, PA-C  hydrOXYzine  (ATARAX ) 25 MG tablet Take 1 tablet (25 mg total) by mouth every 6 (six) hours. 11/27/23   Smoot, Lauraine LABOR, PA-C  ibuprofen  (ADVIL ) 800 MG tablet Take 800 mg by mouth every 8 (eight) hours as needed for mild pain (pain score 1-3), moderate pain (pain score 4-6) or headache.    [provider]  ibuprofen  (ADVIL ,MOTRIN ) 600 MG tablet Take 1 tablet (600 mg total) by mouth every 6 (six) hours as needed. Patient not taking: Reported on 06/07/2023 11/11/15  Kirichenko, Tatyana, PA-C  levofloxacin  (LEVAQUIN ) 750 MG tablet Take 1 tablet (750 mg total) by mouth daily. X 7 days Patient not taking: Reported on 06/07/2023 10/07/14   Nivia Colon, PA-C  lidocaine (LIDODERM) 5 % Place 1 patch onto the skin daily as needed (for pain). Remove & Discard patch within 12 hours or as directed by MD    [provider]  lidocaine (XYLOCAINE) 5 % ointment Apply 1 Application topically 2 (two) times daily as needed (for pain).    [provider]  lubiprostone (AMITIZA) 24 MCG capsule Take 24 mcg by mouth 2 (two) times daily  with a meal.    [provider]  montelukast (SINGULAIR) 10 MG tablet Take 10 mg by mouth daily.    [provider]  olopatadine (PATANOL) 0.1 % ophthalmic solution Place 1 drop into both eyes 2 (two) times daily as needed for allergies.    [provider]  omeprazole  (PRILOSEC) 20 MG capsule Take 1 capsule (20 mg total) by mouth daily. 02/18/24   Towana Ozell BROCKS, MD  ondansetron  (ZOFRAN ) 4 MG tablet Take 1 tablet (4 mg total) by mouth every 8 (eight) hours as needed for nausea or vomiting. Patient not taking: Reported on 06/07/2023 10/07/14   Nivia Colon, PA-C  oxyCODONE -acetaminophen  (PERCOCET/ROXICET) 5-325 MG per tablet Take 1 tablet by mouth every 4 (four) hours as needed for severe pain. Patient not taking: Reported on 06/07/2023 01/31/14   Levander Houston, MD  predniSONE  (DELTASONE ) 20 MG tablet Take 60 mg daily x 2 days then 40 mg daily x 2 days then 20 mg daily x 2 days Patient not taking: Reported on 10/07/2014 06/27/14   Patt Alm Macho, MD  sodium chloride  (OCEAN) 0.65 % nasal spray Place 1 spray into the nose as needed for congestion. 08/19/13   [provider]  traMADol  (ULTRAM ) 50 MG tablet Take 1 tablet (50 mg total) by mouth every 6 (six) hours as needed. Patient not taking: Reported on 06/07/2023 11/11/15   Kirichenko, Tatyana, PA-C    Allergies: Amoxicillin-pot clavulanate, Azithromycin, Bee venom, Cephalexin, Covid-19 mrna vacc (moderna), Doxycycline, Iron sucrose, Metronidazole, Nitrofurantoin, Nitrofurantoin macrocrystal, Other, Peach [prunus persica], Penicillins, Sulfa antibiotics, Sumatriptan, Tomato, Vancomycin, Sertraline hcl, Amitriptyline, Ciprofloxacin, Darunavir, Erythromycin, Gabapentin , Iron, Nortriptyline, Quetiapine, Sertraline, Tape, Tramadol , Zonisamide, Clindamycin, Clindamycin/lincomycin, Latex, Lidocaine, and Sulfamethoxazole-trimethoprim    Review of Systems  Constitutional:  Positive for fatigue and fever.  HENT:  Positive for  congestion, sore throat and voice change.   Respiratory:  Positive for cough and shortness of breath.   Cardiovascular:  Positive for chest pain.  Gastrointestinal:  Positive for nausea.  Neurological:  Positive for light-headedness.    Updated Vital Signs BP (!) 159/87 (BP Location: Right Arm)   Pulse 85   Temp 97.9 F (36.6 C) (Oral)   Resp 18   Ht 5' 5 (1.651 m)   Wt 93 kg   LMP  (LMP Unknown)   SpO2 97%   BMI 34.11 kg/m   Physical Exam Vitals and nursing note reviewed.  Constitutional:      Appearance: She is well-developed.  HENT:     Head: Normocephalic and atraumatic.     Nose: No congestion or rhinorrhea.     Comments: No nosebleeding.    Mouth/Throat:     Pharynx: Oropharynx is clear.  Cardiovascular:     Rate and Rhythm: Normal rate and regular rhythm.     Heart sounds: Normal heart sounds.  Pulmonary:     Effort:  Pulmonary effort is normal.     Breath sounds: Normal breath sounds.     Comments: Perhaps scant occasional wheezes. Abdominal:     General: There is no distension.  Skin:    General: Skin is warm and dry.  Neurological:     Mental Status: She is alert.     (all labs ordered are listed, but only abnormal results are displayed) Labs Reviewed  RESP PANEL BY RT-PCR (RSV, FLU A&B, COVID)  [id number]  BASIC METABOLIC PANEL WITH GFR  CBC  TROPONIN T, HIGH SENSITIVITY    EKG: EKG Interpretation Date/Time:  Monday July 28 2024 11:40:52 EST Ventricular Rate:  86 PR Interval:  156 QRS Duration:  95 QT Interval:  378 QTC Calculation: 453 R Axis:   45  Text Interpretation: Sinus rhythm Confirmed by Ula Barter [id number]) on 07/28/2024 11:43:45 AM  Radiology: [facility] Chest 2 View Result Date: 07/28/2024 CLINICAL DATA:  Shortness of breath. EXAM: CHEST - 2 VIEW COMPARISON:  Chest radiograph dated 02/18/2024. FINDINGS: The heart size and mediastinal contours are within normal limits. Both lungs are clear. The visualized skeletal structures are  unremarkable. IMPRESSION: No active cardiopulmonary disease. Electronically Signed   By: Vanetta Chou M.D.   On: 07/28/2024 12:43     Procedures   Medications Ordered in the ED  sodium chloride  0.9 % bolus 1,000 mL (1,000 mLs Intravenous New Bag/Given 07/28/24 1339)  acetaminophen  (TYLENOL ) tablet 1,000 mg (1,000 mg Oral Given 07/28/24 1336)  ipratropium-albuterol  (DUONEB) 0.5-2.5 (3) MG/3ML nebulizer solution 3 mL (3 mLs Nebulization Given 07/28/24 1337)                                    Medical Decision Making Amount and/or Complexity of Data Reviewed Labs: ordered.    Details: Normal WBC, normal troponin Radiology: ordered and independent interpretation performed.    Details: No pneumonia ECG/medicine tests: ordered and independent interpretation performed.    Details: Nonspecific changes but unchanged from baseline  Risk OTC drugs. Prescription drug management.   Patient is feeling better with treatment including fluids, DuoNeb, Tylenol .  Appears to have a flulike illness.  At this point I doubt a bacterial infection including pneumonia.  She is tolerating p.o.  Her O2 sats are normal.  Will prescribe some cough medicine and have her follow-up with her PCP.  There has been a delay on the return of her COVID and flu testing but she is outside the window to be treated as she has already had symptoms for 5 days.  I think she is stable for discharge home to follow-up with PCP.  Will discharge with return precautions.     Final diagnoses:  Influenza-like illness    ED Discharge Orders          Ordered    guaiFENesin -codeine  100-10 MG/5ML syrup  2 times daily PRN        07/28/24 1416               Freddi Hamilton, MD 07/28/24 1422  "

## 2024-07-28 NOTE — ED Triage Notes (Signed)
 Sore throat, losing voice, cough with green sputum, congestion. Pt has used home inhaler and had no relief. Pt called PCP and they told pt to come to ER for evaluation. Pt states she feels SOB oxygen 99% on RA

## 2024-07-28 NOTE — ED Notes (Signed)
 Provider notified that pt is requesting breathing treatment, Awaiting orders

## 2024-07-28 NOTE — Discharge Instructions (Addendum)
 It appears that you have a virus causing your symptoms.  You may follow-up the COVID, influenza, and RSV test in your MyChart.  We are prescribing you a medicine to treat the cough.  Have caution as this has codeine  in it and do not drive or operate heavy machinery or drink alcohol in conjunction with this.  Do not take other guaifenesin  containing product such as Mucinex .  You may use your albuterol  inhaler 1-2 puffs every 4 hours for the cough or wheezing.  Follow-up with your primary care provider, if you develop fever, coughing up blood, trouble breathing, or any other new/concerning symptoms then return to the ER.

## 2024-08-02 LAB — RESP PANEL BY RT-PCR (RSV, FLU A&B, COVID)  RVPGX2
Influenza A by PCR: NEGATIVE
Influenza B by PCR: NEGATIVE
Resp Syncytial Virus by PCR: NEGATIVE
SARS Coronavirus 2 by RT PCR: NEGATIVE
